# Patient Record
Sex: Female | Born: 1975
Health system: Southern US, Community
[De-identification: ages and names within clinical notes are randomized; demographics above are authoritative.]

## PROBLEM LIST (undated history)

## (undated) DIAGNOSIS — E079 Disorder of thyroid, unspecified: Secondary | ICD-10-CM

## (undated) DIAGNOSIS — K219 Gastro-esophageal reflux disease without esophagitis: Secondary | ICD-10-CM

## (undated) DIAGNOSIS — T7840XA Allergy, unspecified, initial encounter: Secondary | ICD-10-CM

## (undated) DIAGNOSIS — M1990 Unspecified osteoarthritis, unspecified site: Secondary | ICD-10-CM

## (undated) DIAGNOSIS — I10 Essential (primary) hypertension: Secondary | ICD-10-CM

## (undated) HISTORY — DX: Disorder of thyroid, unspecified: E07.9

## (undated) HISTORY — DX: Essential (primary) hypertension: I10

## (undated) HISTORY — DX: Gastro-esophageal reflux disease without esophagitis: K21.9

## (undated) HISTORY — PX: ORIF ANKLE FRACTURE: SHX5408

## (undated) HISTORY — DX: Allergy, unspecified, initial encounter: T78.40XA

## (undated) HISTORY — DX: Unspecified osteoarthritis, unspecified site: M19.90

---

## 2002-11-16 ENCOUNTER — Encounter: Payer: Self-pay | Admitting: Family Medicine

## 2002-11-16 ENCOUNTER — Inpatient Hospital Stay (HOSPITAL_COMMUNITY): Admission: EM | Admit: 2002-11-16 | Discharge: 2002-11-23 | Payer: Self-pay | Admitting: Family Medicine

## 2002-11-26 ENCOUNTER — Inpatient Hospital Stay (HOSPITAL_COMMUNITY): Admission: AD | Admit: 2002-11-26 | Discharge: 2002-11-26 | Payer: Self-pay | Admitting: *Deleted

## 2012-07-28 ENCOUNTER — Ambulatory Visit: Payer: Self-pay | Admitting: Family Medicine

## 2013-07-31 ENCOUNTER — Ambulatory Visit: Payer: Self-pay | Admitting: Orthopedic Surgery

## 2014-12-09 LAB — HM PAP SMEAR: HM PAP: NORMAL

## 2015-10-20 ENCOUNTER — Encounter: Payer: Self-pay | Admitting: Physician Assistant

## 2015-10-20 ENCOUNTER — Ambulatory Visit (INDEPENDENT_AMBULATORY_CARE_PROVIDER_SITE_OTHER): Payer: 59 | Admitting: Physician Assistant

## 2015-10-20 VITALS — BP 116/72 | HR 78 | Temp 98.2°F | Resp 18 | Ht 62.0 in | Wt 180.0 lb

## 2015-10-20 DIAGNOSIS — M5136 Other intervertebral disc degeneration, lumbar region: Secondary | ICD-10-CM | POA: Insufficient documentation

## 2015-10-20 DIAGNOSIS — G43909 Migraine, unspecified, not intractable, without status migrainosus: Secondary | ICD-10-CM | POA: Insufficient documentation

## 2015-10-20 DIAGNOSIS — E039 Hypothyroidism, unspecified: Secondary | ICD-10-CM | POA: Diagnosis not present

## 2015-10-20 DIAGNOSIS — I10 Essential (primary) hypertension: Secondary | ICD-10-CM | POA: Diagnosis not present

## 2015-10-20 DIAGNOSIS — Z7689 Persons encountering health services in other specified circumstances: Secondary | ICD-10-CM

## 2015-10-20 DIAGNOSIS — Z7189 Other specified counseling: Secondary | ICD-10-CM

## 2015-10-20 DIAGNOSIS — E669 Obesity, unspecified: Secondary | ICD-10-CM | POA: Insufficient documentation

## 2015-10-20 DIAGNOSIS — K219 Gastro-esophageal reflux disease without esophagitis: Secondary | ICD-10-CM | POA: Diagnosis not present

## 2015-10-20 DIAGNOSIS — E668 Other obesity: Secondary | ICD-10-CM

## 2015-10-20 DIAGNOSIS — M5126 Other intervertebral disc displacement, lumbar region: Secondary | ICD-10-CM | POA: Insufficient documentation

## 2015-10-20 DIAGNOSIS — IMO0002 Reserved for concepts with insufficient information to code with codable children: Secondary | ICD-10-CM

## 2015-10-20 NOTE — Progress Notes (Signed)
Patient ID: Erika Hart, female   DOB: [birth year], 40 y.o.   MRN: [id number]       Patient: Erika SettleJennifer A Hart Female    DOB: [birth year]   40 y.o.   MRN: [id number] Visit Date: 10/20/2015  Today's Provider: Margaretann LovelessJennifer M Margerite Impastato, PA-C   Chief Complaint  Patient presents with  . New Patient (Initial Visit)  . Hypothyroidism   Subjective:    HPI Pt is here to establish care. Her previous PCP was Lenon [facility]Sarah Gauger at [facility].   She does have history of high blood pressure and is currently on hydrochlorothiazide 12.5 mg. Her blood pressure is well controlled on this dose. She did recently have an episode of dizziness and lightheadedness but realized that she had been taking too much of her medication as she was taking a full 25 mg tab instead of cutting it in half like the directions. Since starting the hydrochlorothiazide correctly she has not had any of those symptoms.  She also has history of hypothyroidism. She states she was diagnosed in her 6720s. She states her most recent thyroid check was done in November of last year. She states her thyroid levels were slightly off and she was to have this rechecked but has not done this as of yet. She states that she feels her thyroid levels are going to be elevated as she is having worsening fatigue and constipation symptoms.  She also has a history of lumbar disc bulge. She has previously seen in orthopedics as well as neurosurgery. Her pain has been well-controlled on dalteparin 75 mg extended release. She decided to not have any surgical intervention being that she was responding well to the ball tearing. She states she does have good days and bad days and it out all depends upon the activity that she does that day.  She also is interested in weight loss. She has been exercising and trying to eat a healthier diet over the last 2 years. She states that over the last 2 years she has lost a total of 45 pounds. She states that over the last  couple of weeks she has hit a plateau and has become very discouraged for this. She does not keep a food diary at this time. She states that she works out 3-4 days per week doing only cardio at this time. She used to do weights as well but recently discontinued weights when she hit the plateau.    Allergies  Allergen Reactions  . Sulfa Antibiotics Rash   Previous Medications   ASPIRIN-ACETAMINOPHEN-CAFFEINE (EXCEDRIN MIGRAINE PO)    Take by mouth.   DICLOFENAC (VOLTAREN) 75 MG EC TABLET    TAKE 1 TABLET (75 MG TOTAL) BY MOUTH 2 (TWO) TIMES DAILY WITH MEALS.   HYDROCHLOROTHIAZIDE (HYDRODIURIL) 25 MG TABLET    TAKE 0.5 TABLETS (12.5 MG TOTAL) BY MOUTH ONCE DAILY.   LEVOTHYROXINE (SYNTHROID, LEVOTHROID) 137 MCG TABLET    TAKE 1 TAB EVERY DAY ON AN EMPTY STOMACH WITH A GLASS OF WATER AT LEAST 30 MIN BEFORE BREAKFAST   SUMATRIPTAN (IMITREX) 100 MG TABLET    Take by mouth.    Review of Systems  Constitutional: Positive for chills, appetite change and fatigue.  Eyes: Negative.   Respiratory: Negative.   Cardiovascular: Negative.   Gastrointestinal: Positive for nausea, abdominal pain, diarrhea, constipation and abdominal distention.  Endocrine: Positive for cold intolerance and polyuria.  Genitourinary: Negative.   Musculoskeletal: Positive for back pain.  Skin: Negative.   Allergic/Immunologic: Negative.  Neurological: Positive for dizziness, light-headedness and headaches.    Social History  Substance Use Topics  . Smoking status: Never Smoker   . Smokeless tobacco: Not on file  . Alcohol Use: Yes     Comment: 1-2 a month   Objective:   BP 116/72 mmHg  Pulse 78  Temp(Src) 98.2 F (36.8 C) (Oral)  Resp 18  Ht  (1.575 m)  Wt 180 lb (81.647 kg)  BMI 32.91 kg/m2  Physical Exam  Constitutional: She is oriented to person, place, and time. She appears well-developed and well-nourished. No distress.  HENT:  Head: Normocephalic and atraumatic.  Right Ear: Tympanic membrane,  external ear and ear canal normal.  Left Ear: Tympanic membrane, external ear and ear canal normal.  Nose: Nose normal.  Mouth/Throat: Uvula is midline, oropharynx is clear and moist and mucous membranes are normal. No oropharyngeal exudate, posterior oropharyngeal edema or posterior oropharyngeal erythema.  Eyes: Conjunctivae and EOM are normal. Pupils are equal, round, and reactive to light. Right eye exhibits no discharge. Left eye exhibits no discharge. No scleral icterus.  Neck: Normal range of motion. Neck supple. No JVD present. No tracheal deviation present. No thyromegaly present.  Cardiovascular: Normal rate, regular rhythm, normal heart sounds and intact distal pulses.  Exam reveals no gallop and no friction rub.   No murmur heard. Pulmonary/Chest: Effort normal and breath sounds normal. No respiratory distress. She has no wheezes. She has no rales. She exhibits no tenderness.  Abdominal: Soft. Bowel sounds are normal. She exhibits no distension and no mass. There is no tenderness. There is no rebound and no guarding.  Musculoskeletal: Normal range of motion. She exhibits no edema or tenderness.  Lymphadenopathy:    She has no cervical adenopathy.  Neurological: She is alert and oriented to person, place, and time.  Skin: Skin is warm and dry. No rash noted. She is not diaphoretic.  Psychiatric: She has a normal mood and affect. Her behavior is normal. Judgment and thought content normal.  Vitals reviewed.       Assessment & Plan:     1. Establishing care with new doctor, encounter for  2. Acquired hypothyroidism Currently on levothyroxine 137 g. I will check thyroid panel as below and follow-up pending lab results. - Thyroid Panel With TSH  3. Essential hypertension Stable on hydrochlorothiazide 12.5 mg. We'll check labs as below. She does state that she has had a history of elevated liver enzymes. She states that the initial time that she had elevated liver enzymes was  approximately 13 years ago after the birth of her first child. She states that her liver enzymes will normalize and then slightly elevated again. She states most recent labs her liver enzymes were normal. She has never had workup for this. - CBC with Differential - Comprehensive Metabolic Panel (CMET)  4. Adult BMI 30+ Currently trying to lose weight. States she has lost 45 pounds over the last 2 years. She is working out 3-4 times per week for 30-40 minutes. She states she is only doing cardio at this time. She states she tries to eat healthy but does not keep a food diary. I did advise her to start keeping a food diary so that she can see how much she is eating. I also advised her to continue her water intake. We also discussed changing up her exercise routine eye little bit as her body is probably become used to what she has been doing. She voiced understanding. I will  see her back in 4 weeks to see how she is doing with these changes. We may consider adding an appetite suppressant at that time if she is still in her plateau.  5. Gastroesophageal reflux disease, esophagitis presence not specified Symptoms come and go. She is not currently on anything for this. She states that she does recognize foods that trigger. She does state that her weight loss has helped her GERD symptoms. She is going to try an over-the-counter treatment such as omeprazole or Zantac. She will call if symptoms worsen.        Margaretann Loveless, PA-C  [facility]

## 2015-10-20 NOTE — Patient Instructions (Signed)

## 2015-10-21 LAB — COMPREHENSIVE METABOLIC PANEL
A/G RATIO: 1.6 (ref 1.2–2.2)
ALT: 46 IU/L — AB (ref 0–32)
AST: 42 IU/L — AB (ref 0–40)
Albumin: 4.5 g/dL (ref 3.5–5.5)
Alkaline Phosphatase: 73 IU/L (ref 39–117)
BUN/Creatinine Ratio: 18 (ref 9–23)
BUN: 16 mg/dL (ref 6–24)
Bilirubin Total: 1 mg/dL (ref 0.0–1.2)
CALCIUM: 9.7 mg/dL (ref 8.7–10.2)
CHLORIDE: 98 mmol/L (ref 96–106)
CO2: 21 mmol/L (ref 18–29)
Creatinine, Ser: 0.91 mg/dL (ref 0.57–1.00)
GFR calc Af Amer: 91 mL/min/{1.73_m2} (ref >59)
GFR, EST NON AFRICAN AMERICAN: 79 mL/min/{1.73_m2} (ref >59)
GLUCOSE: 78 mg/dL (ref 65–99)
Globulin, Total: 2.9 g/dL (ref 1.5–4.5)
POTASSIUM: 4.2 mmol/L (ref 3.5–5.2)
Sodium: 141 mmol/L (ref 134–144)
Total Protein: 7.4 g/dL (ref 6.0–8.5)

## 2015-10-21 LAB — CBC WITH DIFFERENTIAL/PLATELET
BASOS ABS: 0 10*3/uL (ref 0.0–0.2)
Basos: 1 %
EOS (ABSOLUTE): 0 10*3/uL (ref 0.0–0.4)
Eos: 1 %
Hematocrit: 40.7 % (ref 34.0–46.6)
Hemoglobin: 14.3 g/dL (ref 11.1–15.9)
IMMATURE GRANS (ABS): 0 10*3/uL (ref 0.0–0.1)
IMMATURE GRANULOCYTES: 0 %
LYMPHS: 33 %
Lymphocytes Absolute: 1 10*3/uL (ref 0.7–3.1)
MCH: 31.3 pg (ref 26.6–33.0)
MCHC: 35.1 g/dL (ref 31.5–35.7)
MCV: 89 fL (ref 79–97)
MONOS ABS: 0.6 10*3/uL (ref 0.1–0.9)
Monocytes: 20 %
NEUTROS PCT: 45 %
Neutrophils Absolute: 1.4 10*3/uL (ref 1.4–7.0)
PLATELETS: 258 10*3/uL (ref 150–379)
RBC: 4.57 x10E6/uL (ref 3.77–5.28)
RDW: 13.1 % (ref 12.3–15.4)
WBC: 3.2 10*3/uL — AB (ref 3.4–10.8)

## 2015-10-21 LAB — THYROID PANEL WITH TSH
FREE THYROXINE INDEX: 3.3 (ref 1.2–4.9)
T3 UPTAKE RATIO: 29 % (ref 24–39)
T4 TOTAL: 11.5 ug/dL (ref 4.5–12.0)
TSH: 2.04 u[IU]/mL (ref 0.450–4.500)

## 2015-10-23 ENCOUNTER — Telehealth: Payer: Self-pay

## 2015-10-23 NOTE — Telephone Encounter (Signed)
-----   Message from Margaretann LovelessJennifer M Burnette, [place]PA-C sent at 10/21/2015  9:03 AM EDT ----- All labs are within normal limits and stable.  AST and ALT (liver enzymes) are both borderline elevated.  We will continue to monitor these since they have been elevated in the past. Will recheck in 6 months. Thanks! -JB

## 2015-10-23 NOTE — Telephone Encounter (Signed)
Patient advised as directed below.  Thanks,  -Azlee Monforte 

## 2015-11-17 ENCOUNTER — Ambulatory Visit (INDEPENDENT_AMBULATORY_CARE_PROVIDER_SITE_OTHER): Payer: 59 | Admitting: Physician Assistant

## 2015-11-17 ENCOUNTER — Encounter: Payer: Self-pay | Admitting: Physician Assistant

## 2015-11-17 VITALS — BP 120/80 | HR 72 | Temp 98.2°F | Resp 16 | Wt 182.6 lb

## 2015-11-17 DIAGNOSIS — Z713 Dietary counseling and surveillance: Secondary | ICD-10-CM | POA: Diagnosis not present

## 2015-11-17 DIAGNOSIS — E668 Other obesity: Secondary | ICD-10-CM | POA: Diagnosis not present

## 2015-11-17 DIAGNOSIS — IMO0002 Reserved for concepts with insufficient information to code with codable children: Secondary | ICD-10-CM

## 2015-11-17 DIAGNOSIS — E669 Obesity, unspecified: Secondary | ICD-10-CM | POA: Diagnosis not present

## 2015-11-17 MED ORDER — PHENTERMINE HCL 37.5 MG PO CAPS: 37.5000 mg | ORAL_CAPSULE | ORAL | Status: DC

## 2015-11-17 NOTE — Patient Instructions (Signed)
Phentermine tablets or capsules What is this medicine? PHENTERMINE (FEN ter meen) decreases your appetite. It is used with a reduced calorie diet and exercise to help you lose weight. This medicine may be used for other purposes; ask your health care provider or pharmacist if you have questions. What should I tell my health care provider before I take this medicine? They need to know if you have any of these conditions: -agitation -glaucoma -heart disease -high blood pressure -history of substance abuse -lung disease called Primary Pulmonary Hypertension (PPH) -taken an MAOI like Carbex, Eldepryl, Marplan, Nardil, or Parnate in last 14 days -thyroid disease -an unusual or allergic reaction to phentermine, other medicines, foods, dyes, or preservatives -pregnant or trying to get pregnant -breast-feeding How should I use this medicine? Take this medicine by mouth with a glass of water. Follow the directions on the prescription label. This medicine is usually taken 30 minutes before or 1 to 2 hours after breakfast. Avoid taking this medicine in the evening. It may interfere with sleep. Take your doses at regular intervals. Do not take your medicine more often than directed. Talk to your pediatrician regarding the use of this medicine in children. Special care may be needed. Overdosage: If you think you have taken too much of this medicine contact a poison control center or emergency room at once. NOTE: This medicine is only for you. Do not share this medicine with others. What if I miss a dose? If you miss a dose, take it as soon as you can. If it is almost time for your next dose, take only that dose. Do not take double or extra doses. What may interact with this medicine? Do not take this medicine with any of the following medications: -duloxetine -MAOIs like Carbex, Eldepryl, Marplan, Nardil, and Parnate -medicines for colds or breathing difficulties like pseudoephedrine or  phenylephrine -procarbazine -sibutramine -SSRIs like citalopram, escitalopram, fluoxetine, fluvoxamine, paroxetine, and sertraline -stimulants like dexmethylphenidate, methylphenidate or modafinil -venlafaxine This medicine may also interact with the following medications: -medicines for diabetes This list may not describe all possible interactions. Give your health care provider a list of all the medicines, herbs, non-prescription drugs, or dietary supplements you use. Also tell them if you smoke, drink alcohol, or use illegal drugs. Some items may interact with your medicine. What should I watch for while using this medicine? Notify your physician immediately if you become short of breath while doing your normal activities. Do not take this medicine within 6 hours of bedtime. It can keep you from getting to sleep. Avoid drinks that contain caffeine and try to stick to a regular bedtime every night. This medicine was intended to be used in addition to a healthy diet and exercise. The best results are achieved this way. This medicine is only indicated for short-term use. Eventually your weight loss may level out. At that point, the drug will only help you maintain your new weight. Do not increase or in any way change your dose without consulting your doctor. You may get drowsy or dizzy. Do not drive, use machinery, or do anything that needs mental alertness until you know how this medicine affects you. Do not stand or sit up quickly, especially if you are an older patient. This reduces the risk of dizzy or fainting spells. Alcohol may increase dizziness and drowsiness. Avoid alcoholic drinks. What side effects may I notice from receiving this medicine? Side effects that you should report to your doctor or health care professional as soon   as possible: -chest pain, palpitations -depression or severe changes in mood -increased blood pressure -irritability -nervousness or restlessness -severe  dizziness -shortness of breath -problems urinating -unusual swelling of the legs -vomiting Side effects that usually do not require medical attention (report to your doctor or health care professional if they continue or are bothersome): -blurred vision or other eye problems -changes in sexual ability or desire -constipation or diarrhea -difficulty sleeping -dry mouth or unpleasant taste -headache -nausea This list may not describe all possible side effects. Call your doctor for medical advice about side effects. You may report side effects to FDA at 1-800-FDA-1088. Where should I keep my medicine? Keep out of the reach of children. This medicine can be abused. Keep your medicine in a safe place to protect it from theft. Do not share this medicine with anyone. Selling or giving away this medicine is dangerous and against the law. This medicine may cause accidental overdose and death if taken by other adults, children, or pets. Mix any unused medicine with a substance like cat litter or coffee grounds. Then throw the medicine away in a sealed container like a sealed bag or a coffee can with a lid. Do not use the medicine after the expiration date. Store at room temperature between 20 and 25 degrees C (68 and 77 degrees F). Keep container tightly closed. NOTE: This sheet is a summary. It may not cover all possible information. If you have questions about this medicine, talk to your doctor, pharmacist, or health care provider.    2016, Elsevier/Gold Standard. (2014-04-05 16:19:53)  

## 2015-11-17 NOTE — Progress Notes (Signed)
Patient: Erika SettleJennifer A Troung Female    DOB: Jan 31, 1976   40 y.o.   MRN: [id number] Visit Date: 11/17/2015  Today's Provider: Margaretann LovelessJennifer M Valia Wingard, PA-C   Chief Complaint  Patient presents with  . Follow-up    weight   Subjective:    HPI Patient is here for 4 week follow-up weight. She reports keeping a food diary for two weeks only. She has been trying to adhere to a 1300 calorie diet. On review some days she was over, other days she was under. Also she has been wearing a fitbit to keep track of her steps and trying to get between 5000-10000 steps daily. She also ran a 5k two weekends ago.  Current Exercise Habits: Structured exercise class (Goes to the gym), Type of exercise: strength training/weights;Other - see comments (Mostly Cardio), Time (Minutes): 40 (30-40  minutes), Frequency (Times/Week): 4, Weekly Exercise (Minutes/Week): 160, Intensity: Intense Exercise limited by: None identified Wt Readings from Last 3 Encounters:  11/17/15 182 lb 9.6 oz (82.827 kg)  10/20/15 180 lb (81.647 kg)   She does report, however, that she has not exercised regularly over the last week and a half due to having an URI and decreased energy from that.   On discussion of her diet, she has a tendency to eat at least one dessert item daily. We discussed cutting back to only having 1-2 per week, or only 1-2 bites if she eats them more frequently.     Allergies  Allergen Reactions  . Sulfa Antibiotics Rash   Previous Medications   ASPIRIN-ACETAMINOPHEN-CAFFEINE (EXCEDRIN MIGRAINE PO)    Take by mouth.   DICLOFENAC (VOLTAREN) 75 MG EC TABLET    TAKE 1 TABLET (75 MG TOTAL) BY MOUTH 2 (TWO) TIMES DAILY WITH MEALS.   HYDROCHLOROTHIAZIDE (HYDRODIURIL) 25 MG TABLET    TAKE 0.5 TABLETS (12.5 MG TOTAL) BY MOUTH ONCE DAILY.   LEVOTHYROXINE (SYNTHROID, LEVOTHROID) 137 MCG TABLET    TAKE 1 TAB EVERY DAY ON AN EMPTY STOMACH WITH A GLASS OF WATER AT LEAST 30 MIN BEFORE BREAKFAST   SUMATRIPTAN (IMITREX) 100  MG TABLET    Take by mouth.    Review of Systems  Constitutional: Positive for fatigue.  HENT: Positive for sore throat (off and on left side hurts, but improving-was worse last week).   Respiratory: Negative.   Cardiovascular: Negative for chest pain, palpitations and leg swelling.  Psychiatric/Behavioral: Negative.     Social History  Substance Use Topics  . Smoking status: Never Smoker   . Smokeless tobacco: Not on file  . Alcohol Use: Yes     Comment: 1-2 a month   Objective:   BP 120/80 mmHg  Pulse 72  Temp(Src) 98.2 F (36.8 C) (Oral)  Resp 16  Wt 182 lb 9.6 oz (82.827 kg)  LMP   Physical Exam  Constitutional: She appears well-developed and well-nourished. No distress.  Neck: Normal range of motion. Neck supple.  Cardiovascular: Normal rate, regular rhythm and normal heart sounds.  Exam reveals no gallop and no friction rub.   No murmur heard. Pulmonary/Chest: Effort normal and breath sounds normal. No respiratory distress. She has no wheezes. She has no rales.  Skin: She is not diaphoretic.  Psychiatric: She has a normal mood and affect. Her behavior is normal. Judgment and thought content normal.  Vitals reviewed.       Assessment & Plan:     1. Encounter for weight loss counseling Discussed starting to become  more consistent with her food diary and making sure to log all meals and snacks. Continue increasing water intake. Continue 1300-calorie diet. She has been exercising well and getting a total of 160 minutes per week. She has been trying to cut out snacks. Complains of fatigue onset in later day. Discussed adding complex carbs in the morning with breakfast instead of being later in the day. Will also add phentermine as below for appetite suppression. I will see her back in 4 weeks to check her progress. She is to call if she has any adverse reactions, acute issues, questions or concerns. - phentermine 37.5 MG capsule; Take 1 capsule (37.5 mg total) by mouth  every morning.  Dispense: 30 capsule; Refill: 0  2. Adult BMI 30+ See above medical treatment plan. - phentermine 37.5 MG capsule; Take 1 capsule (37.5 mg total) by mouth every morning.  Dispense: 30 capsule; Refill: 0  3. Obese See above medical treatment plan. - phentermine 37.5 MG capsule; Take 1 capsule (37.5 mg total) by mouth every morning.  Dispense: 30 capsule; Refill: 0       Margaretann Loveless, PA-C  [facility]

## 2015-11-20 ENCOUNTER — Encounter: Payer: Self-pay | Admitting: Physician Assistant

## 2015-11-20 DIAGNOSIS — M1711 Unilateral primary osteoarthritis, right knee: Secondary | ICD-10-CM

## 2015-11-20 MED ORDER — DICLOFENAC SODIUM 75 MG PO TBEC: DELAYED_RELEASE_TABLET | ORAL | Status: DC

## 2015-12-15 ENCOUNTER — Ambulatory Visit (INDEPENDENT_AMBULATORY_CARE_PROVIDER_SITE_OTHER): Payer: 59 | Admitting: Physician Assistant

## 2015-12-15 ENCOUNTER — Encounter: Payer: Self-pay | Admitting: Physician Assistant

## 2015-12-15 VITALS — BP 120/80 | HR 102 | Temp 98.1°F | Resp 16 | Wt 175.2 lb

## 2015-12-15 DIAGNOSIS — E669 Obesity, unspecified: Secondary | ICD-10-CM | POA: Diagnosis not present

## 2015-12-15 DIAGNOSIS — I10 Essential (primary) hypertension: Secondary | ICD-10-CM | POA: Diagnosis not present

## 2015-12-15 DIAGNOSIS — E668 Other obesity: Secondary | ICD-10-CM | POA: Diagnosis not present

## 2015-12-15 DIAGNOSIS — Z713 Dietary counseling and surveillance: Secondary | ICD-10-CM

## 2015-12-15 DIAGNOSIS — IMO0002 Reserved for concepts with insufficient information to code with codable children: Secondary | ICD-10-CM

## 2015-12-15 MED ORDER — HYDROCHLOROTHIAZIDE 12.5 MG PO CAPS: 12.5000 mg | ORAL_CAPSULE | Freq: Every day | ORAL | Status: DC

## 2015-12-15 MED ORDER — PHENTERMINE HCL 37.5 MG PO CAPS: 37.5000 mg | ORAL_CAPSULE | ORAL | Status: DC

## 2015-12-15 NOTE — Patient Instructions (Signed)

## 2015-12-15 NOTE — Progress Notes (Signed)
Patient: Erika SettleJennifer A Hart Female    DOB: October 18, 1975   40 y.o.   MRN: [id number] Visit Date: 12/15/2015  Today's Provider: Margaretann LovelessJennifer M Delane Wessinger, PA-C   Chief Complaint  Patient presents with  . Follow-up    weight   Subjective:    HPIPatient is here following up on obesity. She reports she is having three meals a day, supper usually a shake with one snack a day which is strawberries or apple slices.She is not counting calories or keeping a diary. She is on Phentermine to help with appetite.She reports that in the first week the phentermine made her feel jittery. No adverse effects now.  Home Exercise  12/15/2015 11/17/2015  Current Exercise Habits Structured exercise class Structured exercise class  Type of exercise strength training/weights;Other - see comments strength training/weights;Other - see comments  Time (Minutes) 40 40  Frequency (Times/Week) 4 4  Weekly Exercise (Minutes/Week) 160 160  Intensity Intense Intense  Exercise limited by: None identified None identified   Wt Readings from Last 3 Encounters:  12/15/15 175 lb 3.2 oz (79.47 kg)  11/17/15 182 lb 9.6 oz (82.827 kg)  10/20/15 180 lb (81.647 kg)       Allergies  Allergen Reactions  . Sulfa Antibiotics Rash   Previous Medications   ASPIRIN-ACETAMINOPHEN-CAFFEINE (EXCEDRIN MIGRAINE PO)    Take by mouth.   DICLOFENAC (VOLTAREN) 75 MG EC TABLET    TAKE 1 TABLET (75 MG TOTAL) BY MOUTH 2 (TWO) TIMES DAILY WITH MEALS.   HYDROCHLOROTHIAZIDE (HYDRODIURIL) 25 MG TABLET    TAKE 0.5 TABLETS (12.5 MG TOTAL) BY MOUTH ONCE DAILY.   LEVOTHYROXINE (SYNTHROID, LEVOTHROID) 137 MCG TABLET    TAKE 1 TAB EVERY DAY ON AN EMPTY STOMACH WITH A GLASS OF WATER AT LEAST 30 MIN BEFORE BREAKFAST   PHENTERMINE 37.5 MG CAPSULE    Take 1 capsule (37.5 mg total) by mouth every morning.   SUMATRIPTAN (IMITREX) 100 MG TABLET    Take by mouth.    Review of Systems  Constitutional: Negative.   HENT: Negative.   Respiratory: Negative.    Cardiovascular: Negative.   Psychiatric/Behavioral: Negative.     Social History  Substance Use Topics  . Smoking status: Never Smoker   . Smokeless tobacco: Not on file  . Alcohol Use: Yes     Comment: 1-2 a month   Objective:   BP 120/80 mmHg  Pulse 102  Temp(Src) 98.1 F (36.7 C) (Oral)  Resp 16  Wt 175 lb 3.2 oz (79.47 kg)  Physical Exam  Constitutional: She appears well-developed and well-nourished. No distress.  Neck: Normal range of motion. Neck supple.  Cardiovascular: Normal rate, regular rhythm and normal heart sounds.  Exam reveals no gallop and no friction rub.   No murmur heard. Pulmonary/Chest: Effort normal and breath sounds normal. No respiratory distress. She has no wheezes. She has no rales.  Skin: She is not diaphoretic.  Vitals reviewed.       Assessment & Plan:     1. Adult BMI 30+ She is doing very well and has lost 7 pounds in the last 4 weeks which is right with goal of 1-2 pounds per week. Continue current regimen. Will continue phentermine for appetite suppression. I will see her back in 4 weeks for weight check.  - phentermine 37.5 MG capsule; Take 1 capsule (37.5 mg total) by mouth every morning.  Dispense: 30 capsule; Refill: 0  2. Encounter for weight loss counseling See above  medical treatment plan. - phentermine 37.5 MG capsule; Take 1 capsule (37.5 mg total) by mouth every morning.  Dispense: 30 capsule; Refill: 0  3. Obese See above medical treatment plan. - phentermine 37.5 MG capsule; Take 1 capsule (37.5 mg total) by mouth every morning.  Dispense: 30 capsule; Refill: 0  4. Essential hypertension Stable. Diagnosis pulled for medication refill. Continue current medical treatment plan. - hydrochlorothiazide (MICROZIDE) 12.5 MG capsule; Take 1 capsule (12.5 mg total) by mouth daily.  Dispense: 30 capsule; Refill: 6       Margaretann Loveless, PA-C  [facility]

## 2015-12-27 ENCOUNTER — Other Ambulatory Visit: Payer: Self-pay

## 2015-12-27 DIAGNOSIS — M1711 Unilateral primary osteoarthritis, right knee: Secondary | ICD-10-CM

## 2015-12-27 MED ORDER — DICLOFENAC SODIUM 75 MG PO TBEC: DELAYED_RELEASE_TABLET | ORAL | Status: DC

## 2015-12-27 NOTE — Telephone Encounter (Signed)
Patient is requesting refill on Diclofenac. She is also wanting BP medication to be sent into [facility] in [place]. Ok to send? Please advise. Thanks!

## 2015-12-27 NOTE — Telephone Encounter (Deleted)
Patient is requesting refill. Pa

## 2016-01-12 ENCOUNTER — Ambulatory Visit (INDEPENDENT_AMBULATORY_CARE_PROVIDER_SITE_OTHER): Payer: 59 | Admitting: Physician Assistant

## 2016-01-12 ENCOUNTER — Encounter: Payer: Self-pay | Admitting: Physician Assistant

## 2016-01-12 VITALS — BP 110/80 | HR 100 | Temp 98.0°F | Resp 16 | Wt 168.8 lb

## 2016-01-12 DIAGNOSIS — E669 Obesity, unspecified: Secondary | ICD-10-CM | POA: Diagnosis not present

## 2016-01-12 DIAGNOSIS — E668 Other obesity: Secondary | ICD-10-CM

## 2016-01-12 DIAGNOSIS — K2960 Other gastritis without bleeding: Secondary | ICD-10-CM | POA: Diagnosis not present

## 2016-01-12 DIAGNOSIS — IMO0002 Reserved for concepts with insufficient information to code with codable children: Secondary | ICD-10-CM

## 2016-01-12 DIAGNOSIS — Z713 Dietary counseling and surveillance: Secondary | ICD-10-CM

## 2016-01-12 DIAGNOSIS — T3991XA Poisoning by unspecified nonopioid analgesic, antipyretic and antirheumatic, accidental (unintentional), initial encounter: Secondary | ICD-10-CM | POA: Diagnosis not present

## 2016-01-12 DIAGNOSIS — T39395A Adverse effect of other nonsteroidal anti-inflammatory drugs [NSAID], initial encounter: Secondary | ICD-10-CM

## 2016-01-12 MED ORDER — PHENTERMINE HCL 37.5 MG PO CAPS: 37.5000 mg | ORAL_CAPSULE | ORAL | Status: DC

## 2016-01-12 NOTE — Patient Instructions (Signed)

## 2016-01-12 NOTE — Progress Notes (Signed)
Patient: Erika Hart Female    DOB: 08-10-1975   40 y.o.   MRN: [id number] Visit Date: 01/12/2016  Today's Provider: Margaretann Loveless, PA-C   Chief Complaint  Patient presents with  . Follow-up    Weight loss counseling   Subjective:    HPIPatient is here to follow-up on weight loss counseling. She is not keeping a food diary. She is using her fitbit to keep track of her steps. She drinks a protein shake after work out.  Home Exercise  01/12/2016 01/12/2016 12/15/2015 11/17/2015  Current Exercise Habits - Structured exercise class Structured exercise class Structured exercise class  Type of exercise - strength training/weights strength training/weights;Other - see comments strength training/weights;Other - see comments  Time (Minutes) - 40 40 40  Frequency (Times/Week) - Weekly Exercise (Minutes/Week) - 160 160 160  Intensity Intense Moderate Intense Intense  Exercise limited by: - None identified None identified None identified   Wt Readings from Last 3 Encounters:  01/12/16 168 lb 12.8 oz (76.567 kg)  12/15/15 175 lb 3.2 oz (79.47 kg)  11/17/15 182 lb 9.6 oz (82.827 kg)      Allergies  Allergen Reactions  . Sulfa Antibiotics Rash   Current Meds  Medication Sig  . Aspirin-Acetaminophen-Caffeine (EXCEDRIN MIGRAINE PO) Take by mouth.  . diclofenac (VOLTAREN) 75 MG EC tablet TAKE 1 TABLET (75 MG TOTAL) BY MOUTH 2 (TWO) TIMES DAILY WITH MEALS.  . hydrochlorothiazide (MICROZIDE) 12.5 MG capsule Take 1 capsule (12.5 mg total) by mouth daily.  Marland Kitchen levothyroxine (SYNTHROID, LEVOTHROID) 137 MCG tablet TAKE 1 TAB EVERY DAY ON AN EMPTY STOMACH WITH A GLASS OF WATER AT LEAST 30 MIN BEFORE BREAKFAST  . phentermine 37.5 MG capsule Take 1 capsule (37.5 mg total) by mouth every morning.    Review of Systems  Constitutional: Negative.   Respiratory: Negative.   Cardiovascular: Negative.   Gastrointestinal: Positive for constipation.  Psychiatric/Behavioral:  Negative.     Social History  Substance Use Topics  . Smoking status: Never Smoker   . Smokeless tobacco: Not on file  . Alcohol Use: Yes     Comment: 1-2 a month   Objective:   BP 110/80 mmHg  Pulse 100  Temp(Src) 98 F (36.7 C) (Oral)  Resp 16  Wt 168 lb 12.8 oz (76.567 kg)  Physical Exam  Constitutional: She appears well-developed and well-nourished. No distress.  Cardiovascular: Normal rate, regular rhythm and normal heart sounds.  Exam reveals no gallop and no friction rub.   No murmur heard. Pulmonary/Chest: Effort normal and breath sounds normal. No respiratory distress. She has no wheezes. She has no rales.  Skin: She is not diaphoretic.  Vitals reviewed.     Assessment & Plan:     1. Encounter for weight loss counseling She continue to do well with her weight loss. She does mention some decreased appetite control of the phentermine but is still adhering to a low calorie diet. She reports she has increased her exercise and weight resistance training and feels that is also why she feels more hungry. Will go to every other day use for one week. If still not feeling appetite suppression will discontinue for 1-2 weeks then restart. I will see her back in 5 weeks for weight recheck. - phentermine 37.5 MG capsule; Take 1 capsule (37.5 mg total) by mouth every morning.  Dispense: 30 capsule; Refill: 0  2. Adult BMI 30+ See above medical treatment plan. -  phentermine 37.5 MG capsule; Take 1 capsule (37.5 mg total) by mouth every morning.  Dispense: 30 capsule; Refill: 0  3. Obese See above medical treatment plan. - phentermine 37.5 MG capsule; Take 1 capsule (37.5 mg total) by mouth every morning.  Dispense: 30 capsule; Refill: 0  4. Gastritis due to nonsteroidal anti-inflammatory drug (NSAID) Was starting to have belching and abdominal pain in the epigastric region. Started omeprazole OTC BID and has decreased the use of her voltaren. Also now taking voltaren with food.  Reports symptoms have improved. - omeprazole (PRILOSEC) 20 MG capsule; Take 1 capsule (20 mg total) by mouth 2 (two) times daily before a meal.  Dispense: 60 capsule; Refill: 0       Margaretann LovelessJennifer M Sharvil Hoey, PA-C  [facility]

## 2016-02-16 ENCOUNTER — Encounter: Payer: Self-pay | Admitting: Physician Assistant

## 2016-02-16 ENCOUNTER — Ambulatory Visit (INDEPENDENT_AMBULATORY_CARE_PROVIDER_SITE_OTHER): Payer: 59 | Admitting: Physician Assistant

## 2016-02-16 VITALS — BP 100/60 | HR 97 | Temp 98.2°F | Resp 16 | Wt 165.6 lb

## 2016-02-16 DIAGNOSIS — Z713 Dietary counseling and surveillance: Secondary | ICD-10-CM

## 2016-02-16 DIAGNOSIS — E668 Other obesity: Secondary | ICD-10-CM | POA: Diagnosis not present

## 2016-02-16 DIAGNOSIS — E669 Obesity, unspecified: Secondary | ICD-10-CM

## 2016-02-16 DIAGNOSIS — IMO0002 Reserved for concepts with insufficient information to code with codable children: Secondary | ICD-10-CM

## 2016-02-16 MED ORDER — PHENTERMINE HCL 37.5 MG PO CAPS: 37.5000 mg | ORAL_CAPSULE | ORAL | Status: DC

## 2016-02-16 NOTE — Progress Notes (Signed)
Patient: Erika SettleJennifer A Hart Female    DOB: 1976/02/15   40 y.o.   MRN: [id number] Visit Date: 02/16/2016  Today's Provider: Margaretann LovelessJennifer M Burnette, PA-C   Chief Complaint  Patient presents with  . Follow-up    weight loss counselineg   Subjective:    HPI Patient is here to follow up on weight loss counseling. She reports that she is still exercising 3-4 times a week. She is eating small meal portions. Last office visit weight she was 168 lbs today her weight is 165.6 lbs. She does mention that she went on a week long vacation and not too long ago and did not take the phentermine. She did not watch what she was eating and gained 7 pounds back. She has done well since then and was able to get the 7 pounds off plus 3-4 pounds more.     Allergies  Allergen Reactions  . Sulfa Antibiotics Rash   Current Meds  Medication Sig  . Aspirin-Acetaminophen-Caffeine (EXCEDRIN MIGRAINE PO) Take by mouth.  . diclofenac (VOLTAREN) 75 MG EC tablet TAKE 1 TABLET (75 MG TOTAL) BY MOUTH 2 (TWO) TIMES DAILY WITH MEALS.  . hydrochlorothiazide (MICROZIDE) 12.5 MG capsule Take 1 capsule (12.5 mg total) by mouth daily.  Marland Kitchen. levothyroxine (SYNTHROID, LEVOTHROID) 137 MCG tablet TAKE 1 TAB EVERY DAY ON AN EMPTY STOMACH WITH A GLASS OF WATER AT LEAST 30 MIN BEFORE BREAKFAST  . omeprazole (PRILOSEC) 20 MG capsule Take 1 capsule (20 mg total) by mouth 2 (two) times daily before a meal.  . phentermine 37.5 MG capsule Take 1 capsule (37.5 mg total) by mouth every morning.    Review of Systems  Constitutional: Negative.   Respiratory: Negative.   Cardiovascular: Negative.   Gastrointestinal: Negative.   Neurological: Negative.   Psychiatric/Behavioral: Negative.     Social History  Substance Use Topics  . Smoking status: Never Smoker   . Smokeless tobacco: Not on file  . Alcohol Use: Yes     Comment: 1-2 a month   Objective:   BP 100/60 mmHg  Pulse 97  Temp(Src) 98.2 F (36.8 C) (Oral)  Resp 16   Wt 165 lb 9.6 oz (75.116 kg)  Physical Exam  Constitutional: She appears well-developed and well-nourished. No distress.  Cardiovascular: Normal rate, regular rhythm and normal heart sounds.  Exam reveals no gallop and no friction rub.   No murmur heard. Pulmonary/Chest: Effort normal and breath sounds normal. No respiratory distress. She has no wheezes. She has no rales.  Skin: She is not diaphoretic.  Psychiatric: She has a normal mood and affect. Her behavior is normal. Judgment and thought content normal.  Vitals reviewed.     Assessment & Plan:     1. Encounter for weight loss counseling Doing well. Will continue phentermine for appetite suppression. Continue small portions and exercise. I will see her back in 4 weeks to recheck her weight. - phentermine 37.5 MG capsule; Take 1 capsule (37.5 mg total) by mouth every morning.  Dispense: 30 capsule; Refill: 0  2. Adult BMI 30+ See above medical treatment plan. - phentermine 37.5 MG capsule; Take 1 capsule (37.5 mg total) by mouth every morning.  Dispense: 30 capsule; Refill: 0  3. Obese See above medical treatment plan. - phentermine 37.5 MG capsule; Take 1 capsule (37.5 mg total) by mouth every morning.  Dispense: 30 capsule; Refill: 0       Margaretann LovelessJennifer M Burnette, PA-C  [facility]

## 2016-03-04 ENCOUNTER — Ambulatory Visit (INDEPENDENT_AMBULATORY_CARE_PROVIDER_SITE_OTHER): Payer: 59 | Admitting: Physician Assistant

## 2016-03-04 ENCOUNTER — Encounter: Payer: Self-pay | Admitting: Physician Assistant

## 2016-03-04 VITALS — BP 100/60 | HR 84 | Temp 97.8°F | Wt 171.0 lb

## 2016-03-04 DIAGNOSIS — L259 Unspecified contact dermatitis, unspecified cause: Secondary | ICD-10-CM | POA: Diagnosis not present

## 2016-03-04 MED ORDER — METHYLPREDNISOLONE ACETATE 80 MG/ML IJ SUSP
80.0000 mg | Freq: Once | INTRAMUSCULAR | Status: AC
  Administered 2016-03-04: 80 mg via INTRAMUSCULAR

## 2016-03-04 MED ORDER — PREDNISONE 10 MG (21) PO TBPK: ORAL_TABLET | ORAL | 0 refills | Status: DC

## 2016-03-04 NOTE — Patient Instructions (Signed)
Poison Oak Poison oak is an inflammation of the skin (contact dermatitis). It is caused by contact with the allergens on the leaves of the oak (toxicodendron) plants. Depending on your sensitivity, the rash may consist simply of redness and itching, or it may also progress to blisters which may break open (rupture). These must be well cared for to prevent secondary germ (bacterial) infection as these infections can lead to scarring. The eyes may also get puffy. The puffiness is worst in the morning and gets better as the day progresses. Healing is best accomplished by keeping any open areas dry, clean, covered with a bandage, and covered with an antibacterial ointment if needed. Without secondary infection, this dermatitis usually heals without scarring within 2 to 3 weeks without treatment. HOME CARE INSTRUCTIONS When you have been exposed to poison oak, it is very important to thoroughly wash with soap and water as soon as the exposure has been discovered. You have about one half hour to remove the plant resin before it will cause the rash. This cleaning will quickly destroy the oil or antigen on the skin (the antigen is what causes the rash). Wash aggressively under the fingernails as any plant resin still there will continue to spread the rash. Do not rub skin vigorously when washing affected area. Poison oak cannot spread if no oil from the plant remains on your body. Rash that has progressed to weeping sores (lesions) will not spread the rash unless you have not washed thoroughly. It is also important to clean any clothes you have been wearing as they may carry active allergens which will spread the rash, even several days later. Avoidance of the plant in the future is the best measure. Poison oak plants can be recognized by the number of leaves. Generally, poison oak has three leaves with flowering branches on a single stem. Diphenhydramine may be purchased over the counter and used as needed for  itching. Do not drive with this medication if it makes you drowsy. Ask your caregiver about medication for children. SEEK IMMEDIATE MEDICAL CARE IF:   Open areas of the rash develop.  You notice redness extending beyond the area of the rash.  There is a pus like discharge.  There is increased pain.  Other signs of infection develop (such as fever).   This information is not intended to replace advice given to you by your health care provider. Make sure you discuss any questions you have with your health care provider.   Document Released: 01/19/2003 Document Revised: 10/07/2011 Document Reviewed: 12/21/2014 Elsevier Interactive Patient Education 2016 Elsevier Inc.  

## 2016-03-04 NOTE — Progress Notes (Signed)
       Patient: Erika SettleJennifer A Hart Female    DOB: September 22, 1975   40 y.o.   MRN: [id number] Visit Date: 03/04/2016  Today's Provider: Margaretann LovelessJennifer M Sydnee Lamour, PA-C   Chief Complaint  Patient presents with  . Rash   Subjective:    Rash  This is a new problem. The current episode started in the past 7 days (Since Thursday). The problem has been gradually worsening since onset. The affected locations include the abdomen, left arm, right arm, chest, right upper leg and left upper leg. The rash is characterized by blistering, itchiness, draining and pain. She was exposed to plant contact (pulling weeds two weekends ago). Associated symptoms include a sore throat. Pertinent negatives include no anorexia, congestion, cough, diarrhea, eye pain, facial edema, fatigue, fever, joint pain, rhinorrhea, shortness of breath or vomiting. Past treatments include topical steroids, anti-itch cream and antihistamine. The treatment provided no relief.      Allergies  Allergen Reactions  . Sulfa Antibiotics Rash   No outpatient prescriptions have been marked as taking for the 03/04/16 encounter (Office Visit) with Erika LovelessJennifer M Krew Hortman, PA-C.    Review of Systems  Constitutional: Negative for fatigue and fever.  HENT: Positive for sore throat. Negative for congestion, facial swelling, rhinorrhea and trouble swallowing.   Eyes: Negative for pain.  Respiratory: Negative for cough, chest tightness and shortness of breath.   Cardiovascular: Negative for chest pain and leg swelling.  Gastrointestinal: Negative for anorexia, diarrhea and vomiting.  Musculoskeletal: Negative for joint pain.  Skin: Positive for rash.    Social History  Substance Use Topics  . Smoking status: Never Smoker  . Smokeless tobacco: Not on file  . Alcohol use Yes     Comment: 1-2 a month   Objective:   BP 100/60 (BP Location: Left Arm, Patient Position: Sitting, Cuff Size: Normal)   Pulse 84   Temp 97.8 F (36.6 C) (Oral)   Wt 171 lb  (77.6 kg)   LMP 01/26/2016   BMI 31.28 kg/m   Physical Exam  Constitutional: She appears well-developed and well-nourished. No distress.  Cardiovascular: Normal rate, regular rhythm and normal heart sounds.  Exam reveals no gallop and no friction rub.   No murmur heard. Pulmonary/Chest: Effort normal and breath sounds normal. No respiratory distress. She has no wheezes. She has no rales.  Skin: Rash noted. Rash is vesicular. She is not diaphoretic.     Vitals reviewed.     Assessment & Plan:     1. Contact dermatitis Large vesicles noted on left forearm. Advised to not pop vesicles. Cool showers, no scrubbing. May continue benadryl as needed. Depo-medrol 80mg  injection given today and well tolerated. Prednisone 6 day taper also given for her to start tomorrow. May apply hydrocortisone cream/ointment and/or calamine lotion topically for itch. She is to call if any signs of secondary infection develop.  - methylPREDNISolone acetate (DEPO-MEDROL) injection 80 mg; Inject 1 mL (80 mg total) into the muscle once.       Erika LovelessJennifer M Takeshia Wenk, PA-C  [facility]

## 2016-03-22 ENCOUNTER — Ambulatory Visit: Payer: 59 | Admitting: Physician Assistant

## 2016-04-05 ENCOUNTER — Encounter: Payer: Self-pay | Admitting: Physician Assistant

## 2016-04-05 ENCOUNTER — Ambulatory Visit (INDEPENDENT_AMBULATORY_CARE_PROVIDER_SITE_OTHER): Payer: 59 | Admitting: Physician Assistant

## 2016-04-05 ENCOUNTER — Ambulatory Visit: Payer: Self-pay | Admitting: Physician Assistant

## 2016-04-05 VITALS — BP 104/80 | HR 94 | Temp 98.2°F | Resp 16 | Wt 164.8 lb

## 2016-04-05 DIAGNOSIS — E039 Hypothyroidism, unspecified: Secondary | ICD-10-CM

## 2016-04-05 DIAGNOSIS — E668 Other obesity: Secondary | ICD-10-CM

## 2016-04-05 DIAGNOSIS — N926 Irregular menstruation, unspecified: Secondary | ICD-10-CM | POA: Diagnosis not present

## 2016-04-05 DIAGNOSIS — IMO0002 Reserved for concepts with insufficient information to code with codable children: Secondary | ICD-10-CM

## 2016-04-05 DIAGNOSIS — Z713 Dietary counseling and surveillance: Secondary | ICD-10-CM | POA: Diagnosis not present

## 2016-04-05 DIAGNOSIS — E669 Obesity, unspecified: Secondary | ICD-10-CM | POA: Diagnosis not present

## 2016-04-05 MED ORDER — PHENTERMINE HCL 37.5 MG PO CAPS: 37.5000 mg | ORAL_CAPSULE | ORAL | 3 refills | Status: DC

## 2016-04-05 NOTE — Patient Instructions (Signed)

## 2016-04-05 NOTE — Progress Notes (Signed)
Patient: Erika Hart Female    DOB: [birth year]   40 y.o.   MRN: [id number] Visit Date: 04/05/2016  Today's Provider: Margaretann Loveless, PA-C   Chief Complaint  Patient presents with  . Follow-up    Weight   Subjective:    HPI Patient is here today for 4 week follow-up weight loss counseling. Last weight was 171 lb. Today weight is 164.8 lbs. She reports exercising 3-4 times a week. She is trying to work on her dieting better. She did have a few vacations back to back that set her behind, but is trying to get back on track now.  She does also report not having a menstrual cycle since July. She has taken 3 pregnancy test which were all negative. She reports her mother going through early menopause. She reached menarche when she was in 5th grade at an early age. She denies any abnormal paps, family or personal history of uterine fibroids or ovarian cysts. No cancer history.   Wt Readings from Last 3 Encounters:  04/05/16 164 lb 12.8 oz (74.8 kg)  03/04/16 171 lb (77.6 kg)  02/16/16 165 lb 9.6 oz (75.1 kg)      Allergies  Allergen Reactions  . Sulfa Antibiotics Rash     Current Outpatient Prescriptions:  .  Aspirin-Acetaminophen-Caffeine (EXCEDRIN MIGRAINE PO), Take by mouth., Disp: , Rfl:  .  diclofenac (VOLTAREN) 75 MG EC tablet, TAKE 1 TABLET (75 MG TOTAL) BY MOUTH 2 (TWO) TIMES DAILY WITH MEALS., Disp: 60 tablet, Rfl: 6 .  hydrochlorothiazide (MICROZIDE) 12.5 MG capsule, Take 1 capsule (12.5 mg total) by mouth daily., Disp: 30 capsule, Rfl: 6 .  levothyroxine (SYNTHROID, LEVOTHROID) 137 MCG tablet, TAKE 1 TAB EVERY DAY ON AN EMPTY STOMACH WITH A GLASS OF WATER AT LEAST 30 MIN BEFORE BREAKFAST, Disp: , Rfl: 5 .  omeprazole (PRILOSEC) 20 MG capsule, Take 1 capsule (20 mg total) by mouth 2 (two) times daily before a meal., Disp: 60 capsule, Rfl: 0 .  phentermine 37.5 MG capsule, Take 1 capsule (37.5 mg total) by mouth every morning., Disp: 30 capsule, Rfl: 0 .   predniSONE (STERAPRED UNI-PAK 21 TAB) 10 MG (21) TBPK tablet, Take as directed on package directions (Patient not taking: Reported on 04/05/2016), Disp: 21 tablet, Rfl: 0  Review of Systems  Constitutional: Negative.   Respiratory: Negative.   Cardiovascular: Negative.   Gastrointestinal: Negative.   Genitourinary: Positive for menstrual problem.  Neurological: Negative.   Psychiatric/Behavioral: Positive for agitation.    Social History  Substance Use Topics  . Smoking status: Never Smoker  . Smokeless tobacco: Never Used  . Alcohol use Yes     Comment: 1-2 a month   Objective:   BP 104/80 (BP Location: Left Arm, Patient Position: Sitting, Cuff Size: Normal)   Pulse 94   Temp 98.2 F (36.8 C) (Oral)   Resp 16   Wt 164 lb 12.8 oz (74.8 kg)   BMI 30.14 kg/m   Physical Exam  Constitutional: She appears well-developed and well-nourished. No distress.  Neck: Normal range of motion. Neck supple. No tracheal deviation present. No thyromegaly present.  Cardiovascular: Normal rate, regular rhythm and normal heart sounds.  Exam reveals no gallop and no friction rub.   No murmur heard. Pulmonary/Chest: Effort normal and breath sounds normal. No respiratory distress. She has no wheezes. She has no rales.  Musculoskeletal: She exhibits no edema.  Lymphadenopathy:    She has no cervical adenopathy.  Skin: She is not diaphoretic.  Psychiatric: She has a normal mood and affect. Her behavior is normal. Judgment and thought content normal.  Vitals reviewed.     Assessment & Plan:     1. Adult BMI 30+ She is getting back on track with her weight loss goals and has increased her exercise back up. She continues to adjust to better eating habits. Will continue phentermine for appetite suppression x 3 months. I will see her back at that time for weight recheck an dher CPE with pap.  - phentermine 37.5 MG capsule; Take 1 capsule (37.5 mg total) by mouth every morning.  Dispense: 30 capsule;  Refill: 3  2. Encounter for weight loss counseling See above medical treatment plan. - phentermine 37.5 MG capsule; Take 1 capsule (37.5 mg total) by mouth every morning.  Dispense: 30 capsule; Refill: 3  3. Obese See above medical treatment plan. - phentermine 37.5 MG capsule; Take 1 capsule (37.5 mg total) by mouth every morning.  Dispense: 30 capsule; Refill: 3  4. Hypothyroidism, unspecified hypothyroidism type She has been having increased moodiness, occasional hot flash and no menstrual cycle since July. Will recheck thyroid to make sure stable as well as check FSH and LH to see if she is nearing menopause. Will f/u pending results.  - Thyroid Panel With TSH  5. Missed period See above medical treatment plan. - FSH - LH       Margaretann LovelessJennifer M Prayan Ulin, PA-C  [facility]

## 2016-04-06 LAB — THYROID PANEL WITH TSH
FREE THYROXINE INDEX: 2.6 (ref 1.2–4.9)
T3 UPTAKE RATIO: 29 % (ref 24–39)
T4, Total: 8.9 ug/dL (ref 4.5–12.0)
TSH: 0.352 u[IU]/mL — AB (ref 0.450–4.500)

## 2016-04-06 LAB — FOLLICLE STIMULATING HORMONE: FSH: 15.7 m[IU]/mL

## 2016-04-08 ENCOUNTER — Telehealth: Payer: Self-pay

## 2016-04-08 MED ORDER — LEVOTHYROXINE SODIUM 125 MCG PO TABS: 125.0000 ug | ORAL_TABLET | Freq: Every day | ORAL | 0 refills | Status: DC

## 2016-04-08 NOTE — Telephone Encounter (Signed)
Patient advised as directed below. Called the pharmacy that Levothyroxine 125 MCG is the correct prescription.  Thanks,  -Joseline

## 2016-04-08 NOTE — Addendum Note (Signed)
Addended by: Margaretann LovelessBURNETTE, Retta M on: 04/08/2016 01:32 PM   Modules accepted: Orders

## 2016-04-08 NOTE — Telephone Encounter (Signed)
-----   Message from Margaretann LovelessJennifer M Burnette, PA-C sent at 04/08/2016  1:31 PM EDT ----- [facility] and in an ovulating phase. TSH is low indicating over correction with medication. This can affect menstrual cycle as well. I will send in new Rx for lower dose of levothyroxine and we can recheck thyroid panel in 6-8 weeks.

## 2016-04-30 ENCOUNTER — Encounter: Payer: Self-pay | Admitting: Physician Assistant

## 2016-04-30 DIAGNOSIS — I10 Essential (primary) hypertension: Secondary | ICD-10-CM

## 2016-04-30 MED ORDER — HYDROCHLOROTHIAZIDE 12.5 MG PO CAPS: 12.5000 mg | ORAL_CAPSULE | Freq: Every day | ORAL | 6 refills | Status: DC

## 2016-07-05 ENCOUNTER — Encounter: Payer: 59 | Admitting: Physician Assistant

## 2016-08-16 ENCOUNTER — Other Ambulatory Visit: Payer: Self-pay | Admitting: Physician Assistant

## 2016-08-16 ENCOUNTER — Ambulatory Visit: Payer: 59 | Admitting: Physician Assistant

## 2016-08-16 ENCOUNTER — Encounter: Payer: Self-pay | Admitting: Physician Assistant

## 2016-08-16 ENCOUNTER — Ambulatory Visit (INDEPENDENT_AMBULATORY_CARE_PROVIDER_SITE_OTHER): Payer: 59 | Admitting: Physician Assistant

## 2016-08-16 VITALS — BP 100/62 | HR 86 | Temp 97.7°F | Resp 16 | Wt 171.6 lb

## 2016-08-16 DIAGNOSIS — H538 Other visual disturbances: Secondary | ICD-10-CM | POA: Diagnosis not present

## 2016-08-16 DIAGNOSIS — L720 Epidermal cyst: Secondary | ICD-10-CM | POA: Diagnosis not present

## 2016-08-16 DIAGNOSIS — E039 Hypothyroidism, unspecified: Secondary | ICD-10-CM

## 2016-08-16 NOTE — Progress Notes (Signed)
Patient: Erika Hart Female    DOB: 19-Aug-1975   41 y.o.   MRN: [id number] Visit Date: 08/16/2016  Today's Provider: Margaretann LovelessJennifer M Chaia Ikard, PA-C   Chief Complaint  Patient presents with  . Blurred Vision   Subjective:    HPI Patient is here with c/o of blurred vision for the past 10 days together with a headache. She reports that is worse on the left eye and the headache is also on the left side. She is taking her Excedrin Migraine that helps with the headache. She reports that she gets the blurred vision more when she is on her phone and or reading. No tingling or numbness. No focal neurological deficit.   She does report that when she takes the excedrin the headache improves but the blurred vision persisted. No double vision, eye redness or pain. No discharge.  She also has a nodule on her right lower extremity near the ankle. Just started noticing over the last few weeks. Tender to palpation. Moveable. No overlying skin changes. Some irritation with walking.     Allergies  Allergen Reactions  . Sulfa Antibiotics Rash     Current Outpatient Prescriptions:  .  Aspirin-Acetaminophen-Caffeine (EXCEDRIN MIGRAINE PO), Take by mouth., Disp: , Rfl:  .  hydrochlorothiazide (MICROZIDE) 12.5 MG capsule, Take 1 capsule (12.5 mg total) by mouth daily., Disp: 30 capsule, Rfl: 6 .  levothyroxine (SYNTHROID, LEVOTHROID) 125 MCG tablet, Take 1 tablet (125 mcg total) by mouth daily., Disp: 90 tablet, Rfl: 0 .  diclofenac (VOLTAREN) 75 MG EC tablet, TAKE 1 TABLET (75 MG TOTAL) BY MOUTH 2 (TWO) TIMES DAILY WITH MEALS. (Patient not taking: Reported on 08/16/2016), Disp: 60 tablet, Rfl: 6 .  omeprazole (PRILOSEC) 20 MG capsule, Take 1 capsule (20 mg total) by mouth 2 (two) times daily before a meal., Disp: 60 capsule, Rfl: 0 .  phentermine 37.5 MG capsule, Take 1 capsule (37.5 mg total) by mouth every morning. (Patient not taking: Reported on 08/16/2016), Disp: 30 capsule, Rfl: 3  Review of  Systems  Constitutional: Negative.   HENT: Negative.   Eyes: Positive for photophobia and visual disturbance (blurred). Negative for pain, discharge, redness and itching.  Respiratory: Negative for cough, chest tightness, shortness of breath and wheezing.   Cardiovascular: Positive for leg swelling (ankles). Negative for chest pain and palpitations.  Gastrointestinal: Negative.   Genitourinary: Negative.   Neurological: Negative.   Psychiatric/Behavioral: Negative.     Social History  Substance Use Topics  . Smoking status: Never Smoker  . Smokeless tobacco: Never Used  . Alcohol use Yes     Comment: 1-2 a month   Objective:   BP 100/62 (BP Location: Right Arm, Patient Position: Sitting, Cuff Size: Normal)   Pulse 86   Temp 97.7 F (36.5 C) (Oral)   Resp 16   Wt 171 lb 9.6 oz (77.8 kg)   BMI 31.39 kg/m   Physical Exam  Constitutional: She is oriented to person, place, and time. She appears well-developed and well-nourished. No distress.  Eyes: Conjunctivae, EOM and lids are normal. Pupils are equal, round, and reactive to light. Right eye exhibits no chemosis and no discharge. Left eye exhibits no chemosis and no discharge.  Handheld snellen chart used for acuity which was 20/20 bilaterally  Neck: Normal range of motion. Neck supple. No tracheal deviation present. No thyromegaly present.  Cardiovascular: Normal rate, regular rhythm and normal heart sounds.  Exam reveals no gallop and no friction rub.  No murmur heard. Pulmonary/Chest: Effort normal and breath sounds normal. No respiratory distress. She has no wheezes. She has no rales.  Lymphadenopathy:    She has no cervical adenopathy.  Neurological: She is alert and oriented to person, place, and time. She has normal strength. No cranial nerve deficit or sensory deficit. She displays a negative Romberg sign. Coordination and gait normal.  Skin: She is not diaphoretic.     Vitals reviewed.      Assessment & Plan:      1. Blurred vision, left eye Patient has never seen an ophthalmologist. I suspect visual changes as source of blurred vision that is worsening her headaches/migraines. Will place referral as below. If no visual source found as cause I will follow up and order CT head if needed.  - Ambulatory referral to Ophthalmology  2. Cyst of skin and subcutaneous tissue Small subcutaneous, moveable cyst noted on the right lower extremity just medial to the anterior tibialis muscle tendon as it crosses the ankle joint. Possible tendinopathy, but seems cystic more likely. Advised patient to use IBU as needed for pain and moist heat. She is getting ready to start exercise regimen again and I told her if symptoms become debilitating to her exercising I will refer to podiatry. Patient agrees.       Margaretann Loveless, PA-C  [facility]

## 2016-08-16 NOTE — Patient Instructions (Signed)
Blurred Vision Having blurred vision means that you cannot see things clearly. Your vision may seem fuzzy or out of focus. Blurred vision is a very common symptom of an eye or vision problem. Blurred vision is often a gradual blur that occurs in one eye or both eyes. There are many causes of blurred vision, including cataracts, macular degeneration, and diabetic retinopathy. Blurred vision can be diagnosed based on your symptoms and a physical exam. Tell your health care provider about any other health problems you have, any recent eye injury, and any prior surgeries. You may need to see a health care provider who specializes in eye problems (ophthalmologist). Your treatment depends on what is causing your blurred vision.  HOME CARE INSTRUCTIONS  Tell your health care provider about any changes in your blurred vision.  Do not drive or operate heavy machinery if your vision is blurry.  Keep all follow-up visits as directed by your health care provider. This is important. SEEK MEDICAL CARE IF:  Your symptoms get worse.  You have new symptoms.  You have trouble seeing at night.  You have trouble seeing up close or far away.  You have trouble noticing the difference between colors. SEEK IMMEDIATE MEDICAL CARE IF:  You have severe eye pain.  You have a severe headache.  You have flashing lights in your field of vision.  You have a sudden change in vision.  You have a sudden loss of vision.  You have vision change after an injury.  You notice drainage coming from your eyes.  You notice a rash around your eyes. This information is not intended to replace advice given to you by your health care provider. Make sure you discuss any questions you have with your health care provider. Document Released: 07/18/2003 Document Revised: 11/29/2014 Document Reviewed: 06/08/2014 Elsevier Interactive Patient Education  2017 Elsevier Inc.  

## 2016-09-06 ENCOUNTER — Ambulatory Visit (INDEPENDENT_AMBULATORY_CARE_PROVIDER_SITE_OTHER): Payer: 59 | Admitting: Physician Assistant

## 2016-09-06 ENCOUNTER — Encounter: Payer: Self-pay | Admitting: Physician Assistant

## 2016-09-06 VITALS — BP 100/60 | HR 88 | Temp 98.0°F | Resp 16 | Ht 62.0 in | Wt 171.0 lb

## 2016-09-06 DIAGNOSIS — Z1231 Encounter for screening mammogram for malignant neoplasm of breast: Secondary | ICD-10-CM | POA: Diagnosis not present

## 2016-09-06 DIAGNOSIS — Z13220 Encounter for screening for lipoid disorders: Secondary | ICD-10-CM | POA: Diagnosis not present

## 2016-09-06 DIAGNOSIS — E039 Hypothyroidism, unspecified: Secondary | ICD-10-CM | POA: Diagnosis not present

## 2016-09-06 DIAGNOSIS — Z0000 Encounter for general adult medical examination without abnormal findings: Secondary | ICD-10-CM | POA: Diagnosis not present

## 2016-09-06 DIAGNOSIS — Z1239 Encounter for other screening for malignant neoplasm of breast: Secondary | ICD-10-CM

## 2016-09-06 DIAGNOSIS — I10 Essential (primary) hypertension: Secondary | ICD-10-CM

## 2016-09-06 DIAGNOSIS — Z136 Encounter for screening for cardiovascular disorders: Secondary | ICD-10-CM

## 2016-09-06 DIAGNOSIS — Z114 Encounter for screening for human immunodeficiency virus [HIV]: Secondary | ICD-10-CM | POA: Diagnosis not present

## 2016-09-06 NOTE — Patient Instructions (Signed)

## 2016-09-06 NOTE — Progress Notes (Signed)
Patient: Erika Hart, Female    DOB: 1976-01-04, 41 y.o.   MRN: [id number] Visit Date: 09/06/2016  Today's Provider: Margaretann LovelessJennifer M Kannen Moxey, PA-C   Chief Complaint  Patient presents with  . Annual Exam   Subjective:    Annual physical exam Erika Hart is a 41 y.o. female who presents today for health maintenance and complete physical. She feels well. She reports exercising 3-4 times a week. She reports she is sleeping fairly well.  12/09/14 CPE 12/09/14 Pap-neg -----------------------------------------------------------------   Review of Systems  Constitutional: Positive for diaphoresis.  HENT: Negative.   Eyes: Positive for visual disturbance.  Respiratory: Negative.   Cardiovascular: Positive for leg swelling.  Gastrointestinal: Positive for abdominal distention, abdominal pain, constipation, diarrhea and nausea.  Endocrine: Positive for cold intolerance.  Genitourinary: Negative.   Musculoskeletal: Negative.   Skin: Negative.   Allergic/Immunologic: Negative.   Neurological: Positive for dizziness, light-headedness, numbness and headaches.  Hematological: Bruises/bleeds easily.  Psychiatric/Behavioral: Positive for agitation.    Social History      She  reports that she has never smoked. She has never used smokeless tobacco. She reports that she drinks alcohol. She reports that she does not use drugs.       Social History   Social History  . Marital status: Married    Spouse name: N/A  . Number of children: N/A  . Years of education: N/A   Social History Main Topics  . Smoking status: Never Smoker  . Smokeless tobacco: Never Used  . Alcohol use Yes     Comment: 1-2 a month  . Drug use: No  . Sexual activity: Not Asked   Other Topics Concern  . None   Social History Narrative  . None    Past Medical History:  Diagnosis Date  . Allergy   . Arthritis   . GERD (gastroesophageal reflux disease)   . Hypertension   . Thyroid disease       Patient Active Problem List   Diagnosis Date Noted  . Bulge of lumbar disc without myelopathy 10/20/2015  . BP (high blood pressure) 10/20/2015  . Acquired hypothyroidism 10/20/2015  . Headache, migraine 10/20/2015  . Adult BMI 30+ 10/20/2015  . Esophageal reflux 10/20/2015    Past Surgical History:  Procedure Laterality Date  . CESAREAN SECTION  2004    Family History        Family Status  Relation Status  . Mother Alive  . Father Deceased  . Brother Alive  . Daughter Alive        Her family history includes Diabetes in her father; Healthy in her daughter; Stroke in her father; Thyroid disease in her mother.     Allergies  Allergen Reactions  . Sulfa Antibiotics Rash     Current Outpatient Prescriptions:  .  Aspirin-Acetaminophen-Caffeine (EXCEDRIN MIGRAINE PO), Take by mouth., Disp: , Rfl:  .  diclofenac (VOLTAREN) 75 MG EC tablet, TAKE 1 TABLET (75 MG TOTAL) BY MOUTH 2 (TWO) TIMES DAILY WITH MEALS. (Patient taking differently: as needed. TAKE 1 TABLET (75 MG TOTAL) BY MOUTH 2 (TWO) TIMES DAILY WITH MEALS.), Disp: 60 tablet, Rfl: 6 .  hydrochlorothiazide (MICROZIDE) 12.5 MG capsule, Take 1 capsule (12.5 mg total) by mouth daily., Disp: 30 capsule, Rfl: 6 .  levothyroxine (SYNTHROID, LEVOTHROID) 125 MCG tablet, TAKE 1 TABLET (125 MCG TOTAL) BY MOUTH DAILY., Disp: 90 tablet, Rfl: 0 .  omeprazole (PRILOSEC) 20 MG capsule, Take 1 capsule (20  mg total) by mouth 2 (two) times daily before a meal., Disp: 60 capsule, Rfl: 0 .  phentermine 37.5 MG capsule, Take 1 capsule (37.5 mg total) by mouth every morning. (Patient not taking: Reported on 08/16/2016), Disp: 30 capsule, Rfl: 3   Patient Care Team: Margaretann Loveless, PA-C as PCP - General (Family Medicine)      Objective:   Vitals: BP 100/60 (BP Location: Left Arm, Patient Position: Sitting, Cuff Size: Large)   Pulse 88   Temp 98 F (36.7 C) (Oral)   Resp 16   Ht 5\' 2"  (1.575 m)   Wt 171 lb (77.6 kg)   LMP  08/23/2016 (Exact Date)   BMI 31.28 kg/m    Physical Exam  Constitutional: She is oriented to person, place, and time. She appears well-developed and well-nourished. No distress.  HENT:  Head: Normocephalic and atraumatic.  Right Ear: Hearing, tympanic membrane, external ear and ear canal normal.  Left Ear: Hearing, tympanic membrane, external ear and ear canal normal.  Nose: Nose normal.  Mouth/Throat: Uvula is midline, oropharynx is clear and moist and mucous membranes are normal. No oropharyngeal exudate.  Eyes: Conjunctivae and EOM are normal. Pupils are equal, round, and reactive to light. Right eye exhibits no discharge. Left eye exhibits no discharge. No scleral icterus.  Neck: Normal range of motion. Neck supple. No tracheal deviation present. No thyromegaly present.  Cardiovascular: Normal rate, regular rhythm, normal heart sounds and intact distal pulses.  Exam reveals no gallop and no friction rub.   No murmur heard. Pulmonary/Chest: Effort normal and breath sounds normal. No respiratory distress. She has no wheezes. She has no rales. She exhibits no tenderness. Right breast exhibits no inverted nipple, no mass, no nipple discharge, no skin change and no tenderness. Left breast exhibits no inverted nipple, no mass, no nipple discharge, no skin change and no tenderness. Breasts are symmetrical.  Abdominal: Soft. Bowel sounds are normal. She exhibits no distension and no mass. There is no tenderness. There is no rebound and no guarding.  Musculoskeletal: Normal range of motion. She exhibits no edema or tenderness.  Lymphadenopathy:    She has no cervical adenopathy.  Neurological: She is alert and oriented to person, place, and time.  Skin: Skin is warm and dry. No rash noted. She is not diaphoretic.  Psychiatric: She has a normal mood and affect. Her behavior is normal. Judgment and thought content normal.  Vitals reviewed.    Depression Screen PHQ 2/9 Scores 09/06/2016  PHQ - 2  Score 0      Assessment & Plan:     Routine Health Maintenance and Physical Exam  Exercise Activities and Dietary recommendations Goals    None      Immunization History  Administered Date(s) Administered  . Tdap 02/09/2008    Health Maintenance  Topic Date Due  . HIV Screening  03/23/1989  . PAP SMEAR  03/24/1995  . INFLUENZA VACCINE  05/06/2017 (Originally 02/27/2016)  . TETANUS/TDAP  02/08/2018     Discussed health benefits of physical activity, and encouraged her to engage in regular exercise appropriate for her age and condition.    1. Annual physical exam Normal physical exam today. Will check labs as below and f/u pending lab results. If labs are stable and WNL she will not need to have these rechecked for one year at her next annual physical exam. She is to call the office in the meantime if she has any acute issue, questions or concerns. -  CBC w/Diff/Platelet - Comprehensive Metabolic Panel (CMET)  2. Screening for breast cancer Breast exam today was normal. There is no family history of breast cancer. She does perform regular self breast exams. Mammogram was ordered as below. Information for [facility] was given to patient so she may schedule her mammogram at her convenience. - MM Digital Screening; Future  3. Essential hypertension Stable. Continue HCTZ 12.5mg  daily. Will check labs as below and f/u pending results. - CBC w/Diff/Platelet - Comprehensive Metabolic Panel (CMET)  4. Acquired hypothyroidism Stable. Continue levothyroxine daily. Will check labs as below and f/u pending results. - Thyroid Panel With TSH  5. Encounter for lipid screening for cardiovascular disease Will check labs as below and f/u pending results. - Lipid Profile  6. Screening for HIV without presence of risk factors - HIV antibody (with reflex)  --------------------------------------------------------------------    Margaretann Loveless, PA-C    [facility]

## 2016-09-13 DIAGNOSIS — Z0000 Encounter for general adult medical examination without abnormal findings: Secondary | ICD-10-CM | POA: Diagnosis not present

## 2016-09-13 DIAGNOSIS — I10 Essential (primary) hypertension: Secondary | ICD-10-CM | POA: Diagnosis not present

## 2016-09-13 DIAGNOSIS — E039 Hypothyroidism, unspecified: Secondary | ICD-10-CM | POA: Diagnosis not present

## 2016-09-14 LAB — COMPREHENSIVE METABOLIC PANEL
A/G RATIO: 1.4 (ref 1.2–2.2)
ALK PHOS: 50 IU/L (ref 39–117)
ALT: 18 IU/L (ref 0–32)
AST: 16 IU/L (ref 0–40)
Albumin: 4.1 g/dL (ref 3.5–5.5)
BILIRUBIN TOTAL: 0.9 mg/dL (ref 0.0–1.2)
BUN/Creatinine Ratio: 20 (ref 9–23)
BUN: 18 mg/dL (ref 6–24)
CHLORIDE: 101 mmol/L (ref 96–106)
CO2: 25 mmol/L (ref 18–29)
Calcium: 9.3 mg/dL (ref 8.7–10.2)
Creatinine, Ser: 0.92 mg/dL (ref 0.57–1.00)
GFR calc Af Amer: 89 mL/min/{1.73_m2} (ref >59)
GFR calc non Af Amer: 77 mL/min/{1.73_m2} (ref >59)
GLOBULIN, TOTAL: 2.9 g/dL (ref 1.5–4.5)
Glucose: 86 mg/dL (ref 65–99)
POTASSIUM: 4.2 mmol/L (ref 3.5–5.2)
SODIUM: 141 mmol/L (ref 134–144)
Total Protein: 7 g/dL (ref 6.0–8.5)

## 2016-09-14 LAB — THYROID PANEL WITH TSH
Free Thyroxine Index: 2.3 (ref 1.2–4.9)
T3 UPTAKE RATIO: 26 % (ref 24–39)
T4 TOTAL: 8.8 ug/dL (ref 4.5–12.0)
TSH: 1.19 u[IU]/mL (ref 0.450–4.500)

## 2016-09-14 LAB — LIPID PANEL
CHOL/HDL RATIO: 2.3 ratio (ref 0.0–4.4)
Cholesterol, Total: 131 mg/dL (ref 100–199)
HDL: 57 mg/dL (ref >39)
LDL CALC: 62 mg/dL (ref 0–99)
TRIGLYCERIDES: 59 mg/dL (ref 0–149)
VLDL Cholesterol Cal: 12 mg/dL (ref 5–40)

## 2016-09-14 LAB — CBC WITH DIFFERENTIAL/PLATELET
BASOS: 1 %
Basophils Absolute: 0 10*3/uL (ref 0.0–0.2)
EOS (ABSOLUTE): 0.1 10*3/uL (ref 0.0–0.4)
EOS: 2 %
HEMATOCRIT: 40.5 % (ref 34.0–46.6)
Hemoglobin: 13.7 g/dL (ref 11.1–15.9)
IMMATURE GRANULOCYTES: 0 %
Immature Grans (Abs): 0 10*3/uL (ref 0.0–0.1)
LYMPHS ABS: 1.4 10*3/uL (ref 0.7–3.1)
Lymphs: 25 %
MCH: 30.9 pg (ref 26.6–33.0)
MCHC: 33.8 g/dL (ref 31.5–35.7)
MCV: 91 fL (ref 79–97)
MONOS ABS: 0.4 10*3/uL (ref 0.1–0.9)
Monocytes: 7 %
NEUTROS ABS: 3.7 10*3/uL (ref 1.4–7.0)
NEUTROS PCT: 65 %
PLATELETS: 286 10*3/uL (ref 150–379)
RBC: 4.44 x10E6/uL (ref 3.77–5.28)
RDW: 12.9 % (ref 12.3–15.4)
WBC: 5.7 10*3/uL (ref 3.4–10.8)

## 2016-09-14 LAB — HIV ANTIBODY (ROUTINE TESTING W REFLEX): HIV Screen 4th Generation wRfx: NONREACTIVE

## 2016-10-04 ENCOUNTER — Ambulatory Visit
Admission: RE | Admit: 2016-10-04 | Discharge: 2016-10-04 | Disposition: A | Discharge status: Home / Self Care | Payer: 59 | Source: Ambulatory Visit | Attending: Physician Assistant | Admitting: Physician Assistant

## 2016-10-04 DIAGNOSIS — Z1239 Encounter for other screening for malignant neoplasm of breast: Secondary | ICD-10-CM

## 2016-10-04 DIAGNOSIS — Z1231 Encounter for screening mammogram for malignant neoplasm of breast: Secondary | ICD-10-CM | POA: Insufficient documentation

## 2016-10-09 ENCOUNTER — Other Ambulatory Visit: Payer: Self-pay | Admitting: *Deleted

## 2016-10-09 ENCOUNTER — Inpatient Hospital Stay
Admission: RE | Admit: 2016-10-09 | Discharge: 2016-10-09 | Disposition: A | Discharge status: Home / Self Care | Payer: Self-pay | Source: Ambulatory Visit | Attending: *Deleted | Admitting: *Deleted

## 2016-10-09 DIAGNOSIS — Z9289 Personal history of other medical treatment: Secondary | ICD-10-CM

## 2016-10-11 ENCOUNTER — Ambulatory Visit: Payer: Self-pay | Admitting: Physician Assistant

## 2016-10-11 ENCOUNTER — Other Ambulatory Visit: Payer: Self-pay | Admitting: Physician Assistant

## 2016-10-11 DIAGNOSIS — N6489 Other specified disorders of breast: Secondary | ICD-10-CM

## 2016-10-11 DIAGNOSIS — R928 Other abnormal and inconclusive findings on diagnostic imaging of breast: Secondary | ICD-10-CM

## 2016-10-25 ENCOUNTER — Telehealth: Payer: Self-pay

## 2016-10-25 ENCOUNTER — Ambulatory Visit
Admission: RE | Admit: 2016-10-25 | Discharge: 2016-10-25 | Disposition: A | Discharge status: Home / Self Care | Payer: 59 | Source: Ambulatory Visit | Attending: Physician Assistant | Admitting: Physician Assistant

## 2016-10-25 DIAGNOSIS — N6489 Other specified disorders of breast: Secondary | ICD-10-CM | POA: Insufficient documentation

## 2016-10-25 DIAGNOSIS — R928 Other abnormal and inconclusive findings on diagnostic imaging of breast: Secondary | ICD-10-CM

## 2016-10-25 NOTE — Telephone Encounter (Signed)
Pt advised.   Thanks,   -Laura  

## 2016-10-25 NOTE — Telephone Encounter (Signed)
-----   Message from Margaretann Loveless, [place] sent at 10/25/2016 10:58 AM EDT ----- Repeat diagnostic mammogram showed normal fibroglandular tissue as the cause of asymmetry noted. May proceed with regular normal mammogram screenings next year.

## 2016-11-14 ENCOUNTER — Other Ambulatory Visit: Payer: Self-pay | Admitting: Physician Assistant

## 2016-11-14 DIAGNOSIS — E039 Hypothyroidism, unspecified: Secondary | ICD-10-CM

## 2016-11-25 ENCOUNTER — Other Ambulatory Visit: Payer: Self-pay

## 2016-11-25 DIAGNOSIS — I10 Essential (primary) hypertension: Secondary | ICD-10-CM

## 2016-11-25 MED ORDER — HYDROCHLOROTHIAZIDE 12.5 MG PO CAPS: 12.5000 mg | ORAL_CAPSULE | Freq: Every day | ORAL | 1 refills | Status: DC

## 2016-11-25 NOTE — Telephone Encounter (Signed)
90 days supply: Request For Authorization.  Thanks,  -Harrell Niehoff

## 2016-12-09 ENCOUNTER — Ambulatory Visit (INDEPENDENT_AMBULATORY_CARE_PROVIDER_SITE_OTHER): Payer: 59 | Admitting: Physician Assistant

## 2016-12-09 ENCOUNTER — Encounter: Payer: Self-pay | Admitting: Physician Assistant

## 2016-12-09 VITALS — BP 120/72 | HR 72 | Temp 98.0°F | Resp 16 | Wt 177.0 lb

## 2016-12-09 DIAGNOSIS — L298 Other pruritus: Secondary | ICD-10-CM | POA: Diagnosis not present

## 2016-12-09 DIAGNOSIS — T50905A Adverse effect of unspecified drugs, medicaments and biological substances, initial encounter: Secondary | ICD-10-CM

## 2016-12-09 DIAGNOSIS — T887XXA Unspecified adverse effect of drug or medicament, initial encounter: Secondary | ICD-10-CM | POA: Diagnosis not present

## 2016-12-09 MED ORDER — PREDNISONE 10 MG (21) PO TBPK: ORAL_TABLET | ORAL | 0 refills | Status: DC

## 2016-12-09 MED ORDER — HYDROXYZINE HCL 25 MG PO TABS: 25.0000 mg | ORAL_TABLET | Freq: Three times a day (TID) | ORAL | 0 refills | Status: DC | PRN

## 2016-12-09 MED ORDER — METHYLPREDNISOLONE ACETATE 80 MG/ML IJ SUSP
80.0000 mg | Freq: Once | INTRAMUSCULAR | Status: AC
  Administered 2016-12-09: 80 mg via INTRAMUSCULAR

## 2016-12-09 NOTE — Patient Instructions (Signed)
Drug Rash A drug rash is a change in the color or texture of the skin that is caused by a drug. It can develop minutes, hours, or days after the person takes the drug. What are the causes? This condition is usually caused by a drug allergy. It can also be caused by exposure to sunlight after taking a drug that makes the skin sensitive to light. Drugs that commonly cause rashes include:  Penicillin.  Antibiotic medicines.  Medicines that treat seizures.  Medicines that treat cancer (chemotherapy).  Aspirin and other nonsteroidal anti-inflammatory drugs (NSAIDs).  Injectable dyes that contain iodine.  Insulin. What are the signs or symptoms? Symptoms of this condition include:  Redness.  Tiny bumps.  Peeling.  Itching.  Itchy welts (hives).  Swelling. The rash may appear on a small area of skin or all over the body. How is this diagnosed? To diagnose the condition, your health care provider will do a physical exam. He or she may also order tests to find out which drug caused the rash. Tests to find the cause of a rash include:  Skin tests.  Blood tests.  Drug challenge. For this test, you stop taking all of the drugs that you do not need to take, and then you start taking them again by adding back one of the drugs at a time. How is this treated? A drug rash may be treated with medicines, including:  Antihistamines. These may be given to relieve itching.  An NSAID. This may be given to reduce swelling and treat pain.  A steroid drug. This may be given to reduce swelling. The rash usually goes away when the person stops taking the drug that caused it. Follow these instructions at home:  Take medicines only as directed by your health care provider.  Let all of your health care providers know about any drug reactions you have had in the past.  If you have hives, take a cool shower or use a cool compress to relieve itchiness. Contact a health care provider if:  You  have a fever.  Your rash is not going away.  Your rash gets worse.  Your rash comes back.  You have wheezing or coughing. Get help right away if:  You start to have breathing problems.  You start to have shortness of breath.  You face or throat starts to swell.  You have severe weakness with dizziness or fainting.  You have chest pain. This information is not intended to replace advice given to you by your health care provider. Make sure you discuss any questions you have with your health care provider. Document Released: 08/22/2004 Document Revised: 12/21/2015 Document Reviewed: 05/11/2014 Elsevier Interactive Patient Education  2017 [employer]

## 2016-12-09 NOTE — Progress Notes (Signed)
Patient: Erika SettleJennifer A Hart Female    DOB: [birth year]   41 y.o.   MRN: [id number] Visit Date: 12/09/2016  Today's Provider: Margaretann LovelessJennifer M Daylah Sayavong, PA-C   Chief Complaint  Patient presents with  . Rash   Subjective:    HPI  Patient here today C/O rash allover body since yesterday. Patient reports rash is itchy and denies pain. Patient reports she was out during the weekend and was exposed to the sun for many hours. Patient concerned that it may be a reaction from HCTZ while in the sun.     Allergies  Allergen Reactions  . Sulfa Antibiotics Rash     Current Outpatient Prescriptions:  .  Aspirin-Acetaminophen-Caffeine (EXCEDRIN MIGRAINE PO), Take by mouth., Disp: , Rfl:  .  diclofenac (VOLTAREN) 75 MG EC tablet, TAKE 1 TABLET (75 MG TOTAL) BY MOUTH 2 (TWO) TIMES DAILY WITH MEALS. (Patient taking differently: as needed. TAKE 1 TABLET (75 MG TOTAL) BY MOUTH 2 (TWO) TIMES DAILY WITH MEALS.), Disp: 60 tablet, Rfl: 6 .  hydrochlorothiazide (MICROZIDE) 12.5 MG capsule, Take 1 capsule (12.5 mg total) by mouth daily., Disp: 90 capsule, Rfl: 1 .  levothyroxine (SYNTHROID, LEVOTHROID) 125 MCG tablet, TAKE 1 TABLET (125 MCG TOTAL) BY MOUTH DAILY., Disp: 90 tablet, Rfl: 1 .  omeprazole (PRILOSEC) 20 MG capsule, Take 1 capsule (20 mg total) by mouth 2 (two) times daily before a meal., Disp: 60 capsule, Rfl: 0 .  phentermine 37.5 MG capsule, Take 1 capsule (37.5 mg total) by mouth every morning. (Patient not taking: Reported on 08/16/2016), Disp: 30 capsule, Rfl: 3  Review of Systems  Constitutional: Negative.   HENT: Negative for sore throat and trouble swallowing.   Respiratory: Negative.   Cardiovascular: Negative.   Gastrointestinal: Negative.   Skin: Positive for rash.  Neurological: Negative.     Social History  Substance Use Topics  . Smoking status: Never Smoker  . Smokeless tobacco: Never Used  . Alcohol use Yes     Comment: 1-2 a month   Objective:   There were no vitals  taken for this visit. There were no vitals filed for this visit.   Physical Exam  Constitutional: She appears well-developed and well-nourished. No distress.  Neck: Normal range of motion. Neck supple.  Cardiovascular: Normal rate, regular rhythm and normal heart sounds.  Exam reveals no gallop and no friction rub.   No murmur heard. Pulmonary/Chest: Effort normal and breath sounds normal. No respiratory distress. She has no wheezes. She has no rales.  Skin: Rash noted. Rash is papular (diffuse fine papule rash located on upper extremities, chest, abdomen and legs; very pruritic). She is not diaphoretic.  Vitals reviewed.      Assessment & Plan:     1. Adverse effect of drug, initial encounter Depomedrol 80mg  given IM today in the office. 6 day prednisone taper given for her to start tomorrow if needed. Hydroxyzine given for itching. She is to call if no improvement or symptoms worsen. - methylPREDNISolone acetate (DEPO-MEDROL) injection 80 mg; Inject 1 mL (80 mg total) into the muscle once. - predniSONE (STERAPRED UNI-PAK 21 TAB) 10 MG (21) TBPK tablet; Take as directed on package instructions  Dispense: 21 tablet; Refill: 0  2. Itching due to drug See above medical treatment plan. - hydrOXYzine (ATARAX/VISTARIL) 25 MG tablet; Take 1 tablet (25 mg total) by mouth 3 (three) times daily as needed.  Dispense: 30 tablet; Refill: 0       Victorino DikeJennifer  Dorothy Puffer, PA-C  [facility]

## 2017-03-21 ENCOUNTER — Encounter: Payer: Self-pay | Admitting: Physician Assistant

## 2017-03-24 ENCOUNTER — Ambulatory Visit (INDEPENDENT_AMBULATORY_CARE_PROVIDER_SITE_OTHER): Payer: 59 | Admitting: Physician Assistant

## 2017-03-24 ENCOUNTER — Encounter: Payer: Self-pay | Admitting: Physician Assistant

## 2017-03-24 VITALS — BP 110/82 | HR 78 | Temp 98.4°F | Resp 16 | Ht 62.0 in | Wt 177.0 lb

## 2017-03-24 DIAGNOSIS — R5383 Other fatigue: Secondary | ICD-10-CM

## 2017-03-24 DIAGNOSIS — E039 Hypothyroidism, unspecified: Secondary | ICD-10-CM

## 2017-03-24 DIAGNOSIS — R1011 Right upper quadrant pain: Secondary | ICD-10-CM | POA: Diagnosis not present

## 2017-03-24 DIAGNOSIS — G44229 Chronic tension-type headache, not intractable: Secondary | ICD-10-CM

## 2017-03-24 DIAGNOSIS — E161 Other hypoglycemia: Secondary | ICD-10-CM

## 2017-03-24 DIAGNOSIS — R1013 Epigastric pain: Secondary | ICD-10-CM | POA: Diagnosis not present

## 2017-03-24 DIAGNOSIS — Z833 Family history of diabetes mellitus: Secondary | ICD-10-CM

## 2017-03-24 DIAGNOSIS — N926 Irregular menstruation, unspecified: Secondary | ICD-10-CM

## 2017-03-24 NOTE — Patient Instructions (Signed)
Tension Headache A tension headache is a feeling of pain, pressure, or aching that is often felt over the front and sides of the head. The pain can be dull, or it can feel tight (constricting). Tension headaches are not normally associated with nausea or vomiting, and they do not get worse with physical activity. Tension headaches can last from 30 minutes to several days. This is the most common type of headache. CAUSES The exact cause of this condition is not known. Tension headaches often begin after stress, anxiety, or depression. Other triggers may include:  Alcohol.  Too much caffeine, or caffeine withdrawal.  Respiratory infections, such as colds, flu, or sinus infections.  Dental problems or teeth clenching.  Fatigue.  Holding your head and neck in the same position for a long period of time, such as while using a computer.  Smoking. SYMPTOMS Symptoms of this condition include:  A feeling of pressure around the head.  Dull, aching head pain.  Pain felt over the front and sides of the head.  Tenderness in the muscles of the head, neck, and shoulders. DIAGNOSIS This condition may be diagnosed based on your symptoms and a physical exam. Tests may be done, such as a CT scan or an MRI of your head. These tests may be done if your symptoms are severe or unusual. TREATMENT This condition may be treated with lifestyle changes and medicines to help relieve symptoms. HOME CARE INSTRUCTIONS Managing Pain  Take over-the-counter and prescription medicines only as told by your health care provider.  Lie down in a dark, quiet room when you have a headache.  If directed, apply ice to the head and neck area: ? Put ice in a plastic bag. ? Place a towel between your skin and the bag. ? Leave the ice on for 20 minutes, 2-3 times per day.  Use a heating pad or a hot shower to apply heat to the head and neck area as told by your health care provider. Eating and Drinking  Eat meals on  a regular schedule.  Limit alcohol use.  Decrease your caffeine intake, or stop using caffeine. General Instructions  Keep all follow-up visits as told by your health care provider. This is important.  Keep a headache journal to help find out what may trigger your headaches. For example, write down: ? What you eat and drink. ? How much sleep you get. ? Any change to your diet or medicines.  Try massage or other relaxation techniques.  Limit stress.  Sit up straight, and avoid tensing your muscles.  Do not use tobacco products, including cigarettes, chewing tobacco, or e-cigarettes. If you need help quitting, ask your health care provider.  Exercise regularly as told by your health care provider.  Get 7-9 hours of sleep, or the amount recommended by your health care provider. SEEK MEDICAL CARE IF:  Your symptoms are not helped by medicine.  You have a headache that is different from what you normally experience.  You have nausea or you vomit.  You have a fever. SEEK IMMEDIATE MEDICAL CARE IF:  Your headache becomes severe.  You have repeated vomiting.  You have a stiff neck.  You have a loss of vision.  You have problems with speech.  You have pain in your eye or ear.  You have muscular weakness or loss of muscle control.  You lose your balance or you have trouble walking.  You feel faint or you pass out.  You have confusion. This information   is not intended to replace advice given to you by your health care provider. Make sure you discuss any questions you have with your health care provider. Document Released: 07/15/2005 Document Revised: 04/05/2015 Document Reviewed: 11/07/2014 Elsevier Interactive Patient Education  2017 Elsevier Inc. Abdominal Pain, Adult Abdominal pain can be caused by many things. Often, abdominal pain is not serious and it gets better with no treatment or by being treated at home. However, sometimes abdominal pain is serious. Your  health care provider will do a medical history and a physical exam to try to determine the cause of your abdominal pain. Follow these instructions at home:  Take over-the-counter and prescription medicines only as told by your health care provider. Do not take a laxative unless told by your health care provider.  Drink enough fluid to keep your urine clear or pale yellow.  Watch your condition for any changes.  Keep all follow-up visits as told by your health care provider. This is important. Contact a health care provider if:  Your abdominal pain changes or gets worse.  You are not hungry or you lose weight without trying.  You are constipated or have diarrhea for more than 2-3 days.  You have pain when you urinate or have a bowel movement.  Your abdominal pain wakes you up at night.  Your pain gets worse with meals, after eating, or with certain foods.  You are throwing up and cannot keep anything down.  You have a fever. Get help right away if:  Your pain does not go away as soon as your health care provider told you to expect.  You cannot stop throwing up.  Your pain is only in areas of the abdomen, such as the right side or the left lower portion of the abdomen.  You have bloody or black stools, or stools that look like tar.  You have severe pain, cramping, or bloating in your abdomen.  You have signs of dehydration, such as: ? Dark urine, very little urine, or no urine. ? Cracked lips. ? Dry mouth. ? Sunken eyes. ? Sleepiness. ? Weakness. This information is not intended to replace advice given to you by your health care provider. Make sure you discuss any questions you have with your health care provider. Document Released: 04/24/2005 Document Revised: 02/02/2016 Document Reviewed: 12/27/2015 Elsevier Interactive Patient Education  2017 [employer].

## 2017-03-24 NOTE — Progress Notes (Signed)
Patient: Erika Hart Female    DOB: [birth year]   41 y.o.   MRN: [id number] Visit Date: 03/24/2017  Today's Provider: Margaretann Loveless, PA-C   Chief Complaint  Patient presents with  . Abdominal Pain   Subjective:    HPI Abdominal Pain: Patient complains of abdominal pain. The pain is described as sharp and bloating. Pain is located in the diffusely without radiation. Onset was several weeks ago. Symptoms have been gradually worsening since. Aggravating factors: none.  Alleviating factors: antacids. Associated symptoms: headache and nausea. The patient denies vomiting.  Patient reports "shaking" on and off for several weeks. Patient reports symptoms feel like her sugar is dropping. Patient reports symptoms are present before eating and resolves soon after she eats.   Patient reports having irregular periods x's 2 years, last period this year was in July. Patient reports that she has taken pregnancy test and have been negative.    Allergies  Allergen Reactions  . Sulfa Antibiotics Rash     Current Outpatient Prescriptions:  .  Aspirin-Acetaminophen-Caffeine (EXCEDRIN MIGRAINE PO), Take by mouth., Disp: , Rfl:  .  diclofenac (VOLTAREN) 75 MG EC tablet, TAKE 1 TABLET (75 MG TOTAL) BY MOUTH 2 (TWO) TIMES DAILY WITH MEALS. (Patient taking differently: as needed. TAKE 1 TABLET (75 MG TOTAL) BY MOUTH 2 (TWO) TIMES DAILY WITH MEALS.), Disp: 60 tablet, Rfl: 6 .  hydrochlorothiazide (MICROZIDE) 12.5 MG capsule, Take 1 capsule (12.5 mg total) by mouth daily., Disp: 90 capsule, Rfl: 1 .  levothyroxine (SYNTHROID, LEVOTHROID) 125 MCG tablet, TAKE 1 TABLET (125 MCG TOTAL) BY MOUTH DAILY., Disp: 90 tablet, Rfl: 1 .  omeprazole (PRILOSEC) 20 MG capsule, Take 1 capsule (20 mg total) by mouth 2 (two) times daily before a meal., Disp: 60 capsule, Rfl: 0  Review of Systems  Constitutional: Negative.   Respiratory: Negative.   Cardiovascular: Negative.   Gastrointestinal: Positive  for abdominal pain and nausea.  Musculoskeletal: Positive for myalgias and neck pain. Negative for neck stiffness.  Neurological: Positive for headaches. Negative for dizziness and numbness.    Social History  Substance Use Topics  . Smoking status: Never Smoker  . Smokeless tobacco: Never Used  . Alcohol use Yes     Comment: 1-2 a month   Objective:   BP 110/82 (BP Location: Left Arm, Patient Position: Sitting, Cuff Size: Large)   Pulse 78   Temp 98.4 F (36.9 C) (Oral)   Resp 16   Ht 5\' 2"  (1.575 m)   Wt 177 lb (80.3 kg)   SpO2 99%   BMI 32.37 kg/m  Vitals:   03/24/17 0812  BP: 110/82  Pulse: 78  Resp: 16  Temp: 98.4 F (36.9 C)  TempSrc: Oral  SpO2: 99%  Weight: 177 lb (80.3 kg)  Height: 5\' 2"  (1.575 m)     Physical Exam  Constitutional: She is oriented to person, place, and time. She appears well-developed and well-nourished. No distress.  Neck: Normal range of motion. Neck supple. No thyromegaly present.  Cardiovascular: Normal rate, regular rhythm and normal heart sounds.  Exam reveals no gallop and no friction rub.   No murmur heard. Pulmonary/Chest: Effort normal and breath sounds normal. No respiratory distress. She has no wheezes. She has no rales.  Abdominal: Soft. Normal appearance and bowel sounds are normal. She exhibits no distension and no mass. There is no hepatosplenomegaly. There is tenderness in the right upper quadrant and epigastric area. There is no rebound,  no guarding and no CVA tenderness.  Lymphadenopathy:    She has no cervical adenopathy.  Neurological: She is alert and oriented to person, place, and time.  Skin: Skin is warm and dry. She is not diaphoretic.  Vitals reviewed.       Assessment & Plan:     1. RUQ pain DDx: GERD, gastritis, ulcer, cholelithiasis/cholecystitis. I will check labs as below and f/u pending results. RUQ [place] also ordered. Patient does report that omeprazole does help when she takes it. Advised her to start  omeprazole back and take daily x 3 months. Also restart probiotics. I will f/u with patient and see how she is feeling.  - CBC w/Diff/Platelet - Comprehensive Metabolic Panel (CMET) - HgB A1c - US Abdomen Limited RUQ; Future  2. Epigastric pain See above medical treatment plan. - CBC w/Diff/Platelet - Comprehensive Metabolic Panel (CMET) - HgB A1c - US Abdomen Limited RUQ; Future  3. Irregular menses Will check labs as below. Suspect perimenopasual. Mother went through menopause at 62-47 yr old. Will continue to monitor. Discussed safe sex practices to prevent pregnancy. - TSH  4. Chronic tension-type headache, not intractable Headache over left posterior neck, left side occipital region and can radiate to the left eye. Discussed stress management, yoga, chiropractor, massages.  - CBC w/Diff/Platelet - Comprehensive Metabolic Panel (CMET)  5. Acquired hypothyroidism Will check labs as below and f/u pending results. - TSH  6. Hypoglycemic reaction Will check labs as below. Discussed eating smaller frequent meals.  - HgB A1c  7. Family history of diabetes mellitus Will check labs as below and f/u pending results. - HgB A1c  8. Fatigue, unspecified type Will check labs as below and f/u pending results. - Vitamin D (25 hydroxy)       Margaretann Loveless, PA-C  [facility]

## 2017-03-25 ENCOUNTER — Encounter: Payer: Self-pay | Admitting: Physician Assistant

## 2017-03-25 LAB — CBC WITH DIFFERENTIAL/PLATELET
Basophils Absolute: 0 10*3/uL (ref 0.0–0.2)
Basos: 1 %
EOS (ABSOLUTE): 0.1 10*3/uL (ref 0.0–0.4)
Eos: 2 %
Hematocrit: 40 % (ref 34.0–46.6)
Hemoglobin: 14.1 g/dL (ref 11.1–15.9)
IMMATURE GRANS (ABS): 0 10*3/uL (ref 0.0–0.1)
Immature Granulocytes: 0 %
LYMPHS ABS: 1.2 10*3/uL (ref 0.7–3.1)
LYMPHS: 23 %
MCH: 31.4 pg (ref 26.6–33.0)
MCHC: 35.3 g/dL (ref 31.5–35.7)
MCV: 89 fL (ref 79–97)
MONOS ABS: 0.5 10*3/uL (ref 0.1–0.9)
Monocytes: 9 %
NEUTROS ABS: 3.2 10*3/uL (ref 1.4–7.0)
Neutrophils: 65 %
PLATELETS: 267 10*3/uL (ref 150–379)
RBC: 4.49 x10E6/uL (ref 3.77–5.28)
RDW: 13.5 % (ref 12.3–15.4)
WBC: 5 10*3/uL (ref 3.4–10.8)

## 2017-03-25 LAB — COMPREHENSIVE METABOLIC PANEL
A/G RATIO: 1.6 (ref 1.2–2.2)
ALT: 28 IU/L (ref 0–32)
AST: 33 IU/L (ref 0–40)
Albumin: 4.2 g/dL (ref 3.5–5.5)
Alkaline Phosphatase: 57 IU/L (ref 39–117)
BUN/Creatinine Ratio: 13 (ref 9–23)
BUN: 12 mg/dL (ref 6–24)
Bilirubin Total: 1 mg/dL (ref 0.0–1.2)
CALCIUM: 9.8 mg/dL (ref 8.7–10.2)
CO2: 25 mmol/L (ref 20–29)
CREATININE: 0.9 mg/dL (ref 0.57–1.00)
Chloride: 100 mmol/L (ref 96–106)
GFR, EST AFRICAN AMERICAN: 91 mL/min/{1.73_m2} (ref >59)
GFR, EST NON AFRICAN AMERICAN: 79 mL/min/{1.73_m2} (ref >59)
Globulin, Total: 2.6 g/dL (ref 1.5–4.5)
Glucose: 92 mg/dL (ref 65–99)
POTASSIUM: 4.3 mmol/L (ref 3.5–5.2)
Sodium: 140 mmol/L (ref 134–144)
TOTAL PROTEIN: 6.8 g/dL (ref 6.0–8.5)

## 2017-03-25 LAB — HEMOGLOBIN A1C
ESTIMATED AVERAGE GLUCOSE: 94 mg/dL
Hgb A1c MFr Bld: 4.9 % (ref 4.8–5.6)

## 2017-03-25 LAB — VITAMIN D 25 HYDROXY (VIT D DEFICIENCY, FRACTURES): VIT D 25 HYDROXY: 26.1 ng/mL — AB (ref 30.0–100.0)

## 2017-03-25 LAB — TSH: TSH: 0.823 u[IU]/mL (ref 0.450–4.500)

## 2017-03-28 ENCOUNTER — Ambulatory Visit: Payer: 59

## 2017-04-11 ENCOUNTER — Ambulatory Visit: Payer: 59

## 2017-04-29 ENCOUNTER — Telehealth: Payer: Self-pay | Admitting: Physician Assistant

## 2017-04-29 NOTE — Telephone Encounter (Signed)
See below-Hennessey Cantrell V Mikeala Girdler, RMA  

## 2017-04-29 NOTE — Telephone Encounter (Signed)
FYI--Pt cancelled appointment for abdominal ultrasound

## 2017-04-30 NOTE — Telephone Encounter (Signed)
Noted  

## 2017-05-12 ENCOUNTER — Other Ambulatory Visit: Payer: Self-pay | Admitting: Physician Assistant

## 2017-05-12 DIAGNOSIS — I10 Essential (primary) hypertension: Secondary | ICD-10-CM

## 2017-06-15 ENCOUNTER — Other Ambulatory Visit: Payer: Self-pay | Admitting: Physician Assistant

## 2017-06-15 DIAGNOSIS — E039 Hypothyroidism, unspecified: Secondary | ICD-10-CM

## 2017-08-15 ENCOUNTER — Encounter: Payer: Self-pay | Admitting: Physician Assistant

## 2017-08-15 ENCOUNTER — Ambulatory Visit: Payer: Self-pay | Admitting: Physician Assistant

## 2017-08-15 ENCOUNTER — Ambulatory Visit: Payer: 59 | Admitting: Physician Assistant

## 2017-08-15 VITALS — BP 96/70 | HR 84 | Temp 98.4°F | Resp 16 | Ht 62.0 in | Wt 183.0 lb

## 2017-08-15 DIAGNOSIS — E559 Vitamin D deficiency, unspecified: Secondary | ICD-10-CM

## 2017-08-15 DIAGNOSIS — I10 Essential (primary) hypertension: Secondary | ICD-10-CM

## 2017-08-15 DIAGNOSIS — M791 Myalgia, unspecified site: Secondary | ICD-10-CM | POA: Diagnosis not present

## 2017-08-15 DIAGNOSIS — E039 Hypothyroidism, unspecified: Secondary | ICD-10-CM | POA: Diagnosis not present

## 2017-08-15 DIAGNOSIS — R5383 Other fatigue: Secondary | ICD-10-CM

## 2017-08-15 MED ORDER — HYDROCHLOROTHIAZIDE 12.5 MG PO CAPS: 12.5000 mg | ORAL_CAPSULE | Freq: Every day | ORAL | 1 refills | Status: DC

## 2017-08-15 NOTE — Progress Notes (Signed)
Patient: Erika SettleJennifer A Hart Female    DOB: Aug 15, 1975   42 y.o.   MRN: [id number] Visit Date: 08/15/2017  Today's Provider: Margaretann LovelessJennifer M Rutherford Alarie, PA-C   Chief Complaint  Patient presents with  . Hypothyroidism   Subjective:    HPI Patient here today requesting TSH checked. Patient is C/O fatigue, constipation, weight gain, and hair loss. Patient C/O soreness all over body.   Patient requesting refill on HCTZ. Patient reports good tolerance and compliance with medication.    Allergies  Allergen Reactions  . Sulfa Antibiotics Rash     Current Outpatient Medications:  .  Aspirin-Acetaminophen-Caffeine (EXCEDRIN MIGRAINE PO), Take by mouth., Disp: , Rfl:  .  diclofenac (VOLTAREN) 75 MG EC tablet, TAKE 1 TABLET (75 MG TOTAL) BY MOUTH 2 (TWO) TIMES DAILY WITH MEALS. (Patient taking differently: as needed. TAKE 1 TABLET (75 MG TOTAL) BY MOUTH 2 (TWO) TIMES DAILY WITH MEALS.), Disp: 60 tablet, Rfl: 6 .  hydrochlorothiazide (MICROZIDE) 12.5 MG capsule, TAKE 1 CAPSULE (12.5 MG TOTAL) BY MOUTH DAILY., Disp: 90 capsule, Rfl: 1 .  levothyroxine (SYNTHROID, LEVOTHROID) 125 MCG tablet, TAKE 1 TABLET (125 MCG TOTAL) BY MOUTH DAILY., Disp: 90 tablet, Rfl: 1  Review of Systems  Constitutional: Positive for activity change, fatigue and unexpected weight change.  Respiratory: Negative.   Cardiovascular: Negative.   Endocrine: Negative.   Musculoskeletal: Positive for myalgias.     Social History   Tobacco Use  . Smoking status: Never Smoker  . Smokeless tobacco: Never Used  Substance Use Topics  . Alcohol use: Yes    Comment: 1-2 a month   Objective:   BP 96/70 (BP Location: Left Arm, Patient Position: Sitting, Cuff Size: Large)   Pulse 84   Temp 98.4 F (36.9 C) (Oral)   Resp 16   Ht 5\' 2"  (1.575 m)   Wt 183 lb (83 kg)   SpO2 99%   BMI 33.47 kg/m  Vitals:   08/15/17 0859  BP: 96/70  Pulse: 84  Resp: 16  Temp: 98.4 F (36.9 C)  TempSrc: Oral  SpO2: 99%  Weight:  183 lb (83 kg)  Height: 5\' 2"  (1.575 m)     Physical Exam  Constitutional: She appears well-developed and well-nourished. No distress.  HENT:  Head: Normocephalic and atraumatic.  Right Ear: Hearing, tympanic membrane, external ear and ear canal normal.  Left Ear: Hearing, tympanic membrane, external ear and ear canal normal.  Nose: Mucosal edema present. Right sinus exhibits no maxillary sinus tenderness and no frontal sinus tenderness. Left sinus exhibits no maxillary sinus tenderness and no frontal sinus tenderness.  Mouth/Throat: Uvula is midline, oropharynx is clear and moist and mucous membranes are normal. No oropharyngeal exudate.  Eyes: Conjunctivae are normal. Pupils are equal, round, and reactive to light. Right eye exhibits no discharge. Left eye exhibits no discharge. No scleral icterus.  Neck: Normal range of motion. Neck supple. No tracheal deviation present. No thyromegaly present.  Cardiovascular: Normal rate, regular rhythm and normal heart sounds. Exam reveals no gallop and no friction rub.  No murmur heard. Pulmonary/Chest: Effort normal and breath sounds normal. No stridor. No respiratory distress. She has no wheezes. She has no rales.  Lymphadenopathy:    She has no cervical adenopathy.  Skin: Skin is warm and dry. She is not diaphoretic.  Vitals reviewed.     Assessment & Plan:     1. Hypothyroidism, unspecified type Had been stable but recently having more symptoms again.  Will recheck labs and f/u pending results. Continue levothyroxine for now.  - T4 AND TSH - CBC w/Diff/Platelet  2. Myalgia Patient does have family history of autoimmune disorders and is interested in being retested as she has had a positive and a negative ANA in the past, but was never worked up. Will check labs as below and f/u pending results. - T4 AND TSH - ANA,IFA RA Diag Pnl w/rflx Tit/Patn - CBC w/Diff/Platelet - Vitamin D (25 hydroxy)  3. Fatigue, unspecified type See  above medical treatment plan. - T4 AND TSH - ANA,IFA RA Diag Pnl w/rflx Tit/Patn - CBC w/Diff/Platelet - Vitamin D (25 hydroxy)  4. Avitaminosis D H/O this and on supplementation. Will check to make sure she is having increase in Vit D levels. - ANA,IFA RA Diag Pnl w/rflx Tit/Patn - CBC w/Diff/Platelet - Vitamin D (25 hydroxy)  5. Essential hypertension Stable. Diagnosis pulled for medication refill. Continue current medical treatment plan. - hydrochlorothiazide (MICROZIDE) 12.5 MG capsule; Take 1 capsule (12.5 mg total) by mouth daily.  Dispense: 90 capsule; Refill: 1       Margaretann Loveless, PA-C  [facility]

## 2017-08-15 NOTE — Patient Instructions (Signed)
Hypothyroidism Hypothyroidism is a disorder of the thyroid. The thyroid is a large gland that is located in the lower front of the neck. The thyroid releases hormones that control how the body works. With hypothyroidism, the thyroid does not make enough of these hormones. What are the causes? Causes of hypothyroidism may include:  Viral infections.  Pregnancy.  Your own defense system (immune system) attacking your thyroid.  Certain medicines.  Birth defects.  Past radiation treatments to your head or neck.  Past treatment with radioactive iodine.  Past surgical removal of part or all of your thyroid.  Problems with the gland that is located in the center of your brain (pituitary).  What are the signs or symptoms? Signs and symptoms of hypothyroidism may include:  Feeling as though you have no energy (lethargy).  Inability to tolerate cold.  Weight gain that is not explained by a change in diet or exercise habits.  Dry skin.  Coarse hair.  Menstrual irregularity.  Slowing of thought processes.  Constipation.  Sadness or depression.  How is this diagnosed? Your health care provider may diagnose hypothyroidism with blood tests and ultrasound tests. How is this treated? Hypothyroidism is treated with medicine that replaces the hormones that your body does not make. After you begin treatment, it may take several weeks for symptoms to go away. Follow these instructions at home:  Take medicines only as directed by your health care provider.  If you start taking any new medicines, tell your health care provider.  Keep all follow-up visits as directed by your health care provider. This is important. As your condition improves, your dosage needs may change. You will need to have blood tests regularly so that your health care provider can watch your condition. Contact a health care provider if:  Your symptoms do not get better with treatment.  You are taking thyroid  replacement medicine and: ? You sweat excessively. ? You have tremors. ? You feel anxious. ? You lose weight rapidly. ? You cannot tolerate heat. ? You have emotional swings. ? You have diarrhea. ? You feel weak. Get help right away if:  You develop chest pain.  You develop an irregular heartbeat.  You develop a rapid heartbeat. This information is not intended to replace advice given to you by your health care provider. Make sure you discuss any questions you have with your health care provider. Document Released: 07/15/2005 Document Revised: 12/21/2015 Document Reviewed: 11/30/2013 Elsevier Interactive Patient Education  2018 Elsevier Inc.  

## 2017-08-18 LAB — CBC WITH DIFFERENTIAL/PLATELET
BASOS ABS: 0 10*3/uL (ref 0.0–0.2)
BASOS: 1 %
EOS (ABSOLUTE): 0.1 10*3/uL (ref 0.0–0.4)
Eos: 2 %
Hematocrit: 44.7 % (ref 34.0–46.6)
Hemoglobin: 15.2 g/dL (ref 11.1–15.9)
IMMATURE GRANS (ABS): 0 10*3/uL (ref 0.0–0.1)
IMMATURE GRANULOCYTES: 0 %
LYMPHS: 23 %
Lymphocytes Absolute: 1.4 10*3/uL (ref 0.7–3.1)
MCH: 30.6 pg (ref 26.6–33.0)
MCHC: 34 g/dL (ref 31.5–35.7)
MCV: 90 fL (ref 79–97)
Monocytes Absolute: 0.4 10*3/uL (ref 0.1–0.9)
Monocytes: 6 %
NEUTROS PCT: 68 %
Neutrophils Absolute: 4.3 10*3/uL (ref 1.4–7.0)
PLATELETS: 313 10*3/uL (ref 150–379)
RBC: 4.96 x10E6/uL (ref 3.77–5.28)
RDW: 13.3 % (ref 12.3–15.4)
WBC: 6.2 10*3/uL (ref 3.4–10.8)

## 2017-08-18 LAB — ANA,IFA RA DIAG PNL W/RFLX TIT/PATN
ANA Titer 1: NEGATIVE
CYCLIC CITRULLIN PEPTIDE AB: 9 U (ref 0–19)

## 2017-08-18 LAB — VITAMIN D 25 HYDROXY (VIT D DEFICIENCY, FRACTURES): VIT D 25 HYDROXY: 30.7 ng/mL (ref 30.0–100.0)

## 2017-08-18 LAB — T4 AND TSH
T4, Total: 9.9 ug/dL (ref 4.5–12.0)
TSH: 0.35 u[IU]/mL — ABNORMAL LOW (ref 0.450–4.500)

## 2017-08-19 ENCOUNTER — Telehealth: Payer: Self-pay

## 2017-08-19 ENCOUNTER — Emergency Department: Payer: Commercial Managed Care - HMO

## 2017-08-19 ENCOUNTER — Inpatient Hospital Stay
Admission: AD | Admit: 2017-08-19 | Payer: Self-pay | Source: Other Acute Inpatient Hospital | Admitting: Orthopedic Surgery

## 2017-08-19 ENCOUNTER — Emergency Department
Admission: EM | Admit: 2017-08-19 | Discharge: 2017-08-20 | Disposition: A | Discharge status: Other Acute Inpatient Hospital | Payer: Commercial Managed Care - HMO | Attending: Emergency Medicine | Admitting: Emergency Medicine

## 2017-08-19 ENCOUNTER — Telehealth: Payer: Self-pay | Admitting: Physician Assistant

## 2017-08-19 ENCOUNTER — Encounter: Payer: Self-pay | Admitting: Emergency Medicine

## 2017-08-19 ENCOUNTER — Other Ambulatory Visit: Payer: Self-pay

## 2017-08-19 DIAGNOSIS — S82831B Other fracture of upper and lower end of right fibula, initial encounter for open fracture type I or II: Secondary | ICD-10-CM | POA: Insufficient documentation

## 2017-08-19 DIAGNOSIS — E039 Hypothyroidism, unspecified: Secondary | ICD-10-CM | POA: Insufficient documentation

## 2017-08-19 DIAGNOSIS — Y998 Other external cause status: Secondary | ICD-10-CM | POA: Insufficient documentation

## 2017-08-19 DIAGNOSIS — S82401A Unspecified fracture of shaft of right fibula, initial encounter for closed fracture: Secondary | ICD-10-CM | POA: Diagnosis not present

## 2017-08-19 DIAGNOSIS — S82201B Unspecified fracture of shaft of right tibia, initial encounter for open fracture type I or II: Secondary | ICD-10-CM

## 2017-08-19 DIAGNOSIS — Y9389 Activity, other specified: Secondary | ICD-10-CM | POA: Diagnosis not present

## 2017-08-19 DIAGNOSIS — S8291XA Unspecified fracture of right lower leg, initial encounter for closed fracture: Secondary | ICD-10-CM | POA: Diagnosis not present

## 2017-08-19 DIAGNOSIS — S82391B Other fracture of lower end of right tibia, initial encounter for open fracture type I or II: Secondary | ICD-10-CM | POA: Insufficient documentation

## 2017-08-19 DIAGNOSIS — Z23 Encounter for immunization: Secondary | ICD-10-CM | POA: Insufficient documentation

## 2017-08-19 DIAGNOSIS — Z79899 Other long term (current) drug therapy: Secondary | ICD-10-CM | POA: Diagnosis not present

## 2017-08-19 DIAGNOSIS — S82401B Unspecified fracture of shaft of right fibula, initial encounter for open fracture type I or II: Secondary | ICD-10-CM

## 2017-08-19 DIAGNOSIS — I10 Essential (primary) hypertension: Secondary | ICD-10-CM | POA: Insufficient documentation

## 2017-08-19 DIAGNOSIS — S99921A Unspecified injury of right foot, initial encounter: Secondary | ICD-10-CM | POA: Diagnosis present

## 2017-08-19 DIAGNOSIS — S82201A Unspecified fracture of shaft of right tibia, initial encounter for closed fracture: Secondary | ICD-10-CM | POA: Diagnosis not present

## 2017-08-19 DIAGNOSIS — Y929 Unspecified place or not applicable: Secondary | ICD-10-CM | POA: Diagnosis not present

## 2017-08-19 DIAGNOSIS — W000XXA Fall on same level due to ice and snow, initial encounter: Secondary | ICD-10-CM | POA: Diagnosis not present

## 2017-08-19 DIAGNOSIS — S8261XA Displaced fracture of lateral malleolus of right fibula, initial encounter for closed fracture: Secondary | ICD-10-CM | POA: Diagnosis not present

## 2017-08-19 MED ORDER — TETANUS-DIPHTH-ACELL PERTUSSIS 5-2.5-18.5 LF-MCG/0.5 IM SUSP
0.5000 mL | Freq: Once | INTRAMUSCULAR | Status: AC
  Administered 2017-08-19: 0.5 mL via INTRAMUSCULAR
  Filled 2017-08-19: qty 0.5

## 2017-08-19 MED ORDER — FENTANYL CITRATE (PF) 100 MCG/2ML IJ SOLN
INTRAMUSCULAR | Status: AC
  Administered 2017-08-19: 100 ug via INTRAVENOUS
  Filled 2017-08-19: qty 2

## 2017-08-19 MED ORDER — SODIUM CHLORIDE 0.9 % IV SOLN
INTRAVENOUS | Status: AC | PRN
  Administered 2017-08-19: 1000 mL via INTRAVENOUS

## 2017-08-19 MED ORDER — FENTANYL CITRATE (PF) 100 MCG/2ML IJ SOLN
INTRAMUSCULAR | Status: AC
  Filled 2017-08-19: qty 4

## 2017-08-19 MED ORDER — FENTANYL CITRATE (PF) 100 MCG/2ML IJ SOLN
100.0000 ug | Freq: Once | INTRAMUSCULAR | Status: AC
  Administered 2017-08-19: 100 ug via INTRAVENOUS

## 2017-08-19 MED ORDER — FLUMAZENIL 0.5 MG/5ML IV SOLN
INTRAVENOUS | Status: AC
  Filled 2017-08-19: qty 5

## 2017-08-19 MED ORDER — HYDROMORPHONE HCL 1 MG/ML IJ SOLN
1.0000 mg | Freq: Once | INTRAMUSCULAR | Status: AC
  Administered 2017-08-19: 1 mg via INTRAVENOUS

## 2017-08-19 MED ORDER — ONDANSETRON HCL 4 MG/2ML IJ SOLN
INTRAMUSCULAR | Status: AC
  Filled 2017-08-19: qty 2

## 2017-08-19 MED ORDER — ONDANSETRON HCL 4 MG/2ML IJ SOLN
4.0000 mg | Freq: Once | INTRAMUSCULAR | Status: AC
  Administered 2017-08-19: 4 mg via INTRAVENOUS

## 2017-08-19 MED ORDER — MIDAZOLAM HCL 5 MG/5ML IJ SOLN
INTRAMUSCULAR | Status: AC
  Administered 2017-08-19: 2 mg via INTRAVENOUS
  Filled 2017-08-19: qty 10

## 2017-08-19 MED ORDER — HYDROMORPHONE HCL 1 MG/ML IJ SOLN
1.0000 mg | INTRAMUSCULAR | Status: AC
  Administered 2017-08-19: 1 mg via INTRAVENOUS
  Filled 2017-08-19: qty 1

## 2017-08-19 MED ORDER — HYDROMORPHONE HCL 1 MG/ML IJ SOLN
INTRAMUSCULAR | Status: DC
Start: 2017-08-19 — End: 2017-08-20
  Filled 2017-08-19: qty 1

## 2017-08-19 MED ORDER — DIAZEPAM 5 MG/ML IJ SOLN: INTRAMUSCULAR | Status: AC | PRN

## 2017-08-19 MED ORDER — DEXTROSE 5 % IV SOLN
1.0000 g | Freq: Once | INTRAVENOUS | Status: AC
  Administered 2017-08-19: 1 g via INTRAVENOUS
  Filled 2017-08-19: qty 10

## 2017-08-19 MED ORDER — FENTANYL CITRATE (PF) 100 MCG/2ML IJ SOLN
INTRAMUSCULAR | Status: AC | PRN
  Administered 2017-08-19 (×2): 50 ug via INTRAVENOUS

## 2017-08-19 MED ORDER — LEVOTHYROXINE SODIUM 112 MCG PO TABS: 125.0000 ug | ORAL_TABLET | Freq: Every day | ORAL | 3 refills | Status: DC

## 2017-08-19 MED ORDER — NALOXONE HCL 2 MG/2ML IJ SOSY
PREFILLED_SYRINGE | INTRAMUSCULAR | Status: AC
  Filled 2017-08-19: qty 2

## 2017-08-19 MED ORDER — MIDAZOLAM HCL 5 MG/5ML IJ SOLN
INTRAMUSCULAR | Status: AC | PRN
  Administered 2017-08-19 (×2): 2 mg via INTRAVENOUS

## 2017-08-19 NOTE — Telephone Encounter (Signed)
Joni ReiningNicole with [facility] in [place] stated the Rx they received for levothyroxine (SYNTHROID, LEVOTHROID) 112 MCG tablet was for the [employer] manufacture and they don't have that in stock. Joni Reiningicole is requesting a call back for verbal permission to change it to [place] which they have in stock. Please advise. Thanks TNP

## 2017-08-19 NOTE — Sedation Documentation (Signed)
X-ray at bedside

## 2017-08-19 NOTE — Telephone Encounter (Signed)
-----   Message from Margaretann LovelessJennifer M Burnette, [place]PA-C sent at 08/19/2017 10:17 AM EST ----- Thyroid is actually slightly overcorrected. Would decrease levothyroxine to 112 mcg if agreeable. Autoimmune and rheumatoid factor labs are negative. Vit D is low normal at 30.7. Continue OTC Vit D supplementation of 1000-2000 IU daily. Blood count is normal.

## 2017-08-19 NOTE — ED Provider Notes (Signed)
[facility] Emergency Department Provider Note   ____________________________________________   First MD Initiated Contact with Patient 08/19/17 1859     (approximate)  I have reviewed the triage vital signs and the nursing notes.   HISTORY  Chief Complaint Ankle Pain    HPI Erika Hart is a 42 y.o. female Patient slipped on the ice and has an obvious deformity of her ankle she has no other injuries she says.pain in the ankle severe. She has no numbness distally.  Past Medical History:  Diagnosis Date  . Allergy   . Arthritis   . GERD (gastroesophageal reflux disease)   . Hypertension   . Thyroid disease     Patient Active Problem List   Diagnosis Date Noted  . Bulge of lumbar disc without myelopathy 10/20/2015  . BP (high blood pressure) 10/20/2015  . Acquired hypothyroidism 10/20/2015  . Headache, migraine 10/20/2015  . Adult BMI 30+ 10/20/2015  . Esophageal reflux 10/20/2015    Past Surgical History:  Procedure Laterality Date  . CESAREAN SECTION  2004    Prior to Admission medications   Medication Sig Start Date End Date Taking? Authorizing Provider  Aspirin-Acetaminophen-Caffeine (EXCEDRIN MIGRAINE PO) Take by mouth.    [provider]  diclofenac (VOLTAREN) 75 MG EC tablet TAKE 1 TABLET (75 MG TOTAL) BY MOUTH 2 (TWO) TIMES DAILY WITH MEALS. Patient taking differently: as needed. TAKE 1 TABLET (75 MG TOTAL) BY MOUTH 2 (TWO) TIMES DAILY WITH MEALS. 12/27/15   Margaretann Loveless, PA-C  hydrochlorothiazide (MICROZIDE) 12.5 MG capsule Take 1 capsule (12.5 mg total) by mouth daily. 08/15/17   Margaretann Loveless, PA-C  levothyroxine (SYNTHROID, LEVOTHROID) 112 MCG tablet Take 1 tablet (112 mcg total) by mouth daily. 08/19/17   Margaretann Loveless, PA-C    Allergies Sulfa antibiotics  Family History  Problem Relation Age of Onset  . Thyroid disease Mother   . Diabetes Father   . Stroke Father   . Healthy Daughter    . Breast cancer Neg Hx     Social History Social History   Tobacco Use  . Smoking status: Never Smoker  . Smokeless tobacco: Never Used  Substance Use Topics  . Alcohol use: Yes    Comment: 1-2 a month  . Drug use: No    Review of Systems  Constitutional: No fever/chills Eyes: No visual changes. ENT: No sore throat. Cardiovascular: Denies chest pain. Respiratory: Denies shortness of breath. Gastrointestinal: No abdominal pain.  No nausea, no vomiting.  No diarrhea.  No constipation. Genitourinary: Negative for dysuria. Musculoskeletal: Negative for back pain. Skin: Negative for rash. Neurological: Negative for headaches, focal weakness  ____________________________________________   PHYSICAL EXAM:  VITAL SIGNS: ED Triage Vitals  Enc Vitals Group     BP 08/19/17 1919 121/84     Pulse Rate 08/19/17 1919 82     Resp 08/19/17 1919 (!) 22     Temp 08/19/17 1919 97.9 F (36.6 C)     Temp Source 08/19/17 1919 Oral     SpO2 08/19/17 1919 100 %     Weight --      Height --      Head Circumference --      Peak Flow --      Pain Score 08/19/17 1851 10     Pain Loc --      Pain Edu? --      Excl. in GC? --     Constitutional: Alert and oriented. Well  appearing and in no acute distress. Eyes: Conjunctivae are normal.  Head: Atraumatic. Nose: No congestion/rhinnorhea. Mouth/Throat: Mucous membranes are moist.  Oropharynx non-erythematous. Neck: No stridor. Cardiovascular: Normal rate, regular rhythm. Grossly normal heart sounds.  Good peripheral circulation. Respiratory: Normal respiratory effort.  No retractions. Lungs CTAB. Gastrointestinal: Soft and nontender. No distention. No abdominal bruits. No CVA tenderness. Musculoskeletal: patient has deformity of the ankle there are several puncture wounds and skin which are oozing small amounts of blood. Neurologic:  Normal speech and language. No gross focal neurologic deficits are appreciated.  Skin:  Skin is warm,  dry and intact. No rash noted. Psychiatric: Mood and affect are normal. Speech and behavior are normal.  ____________________________________________   LABS (all labs ordered are listed, but only abnormal results are displayed)  Labs Reviewed - No data to display ____________________________________________  EKG   ____________________________________________  RADIOLOGY  patient has a fracture of both the tibia and fibula distally.____________________________________________   PROCEDURES  Procedure(s) performed: consent obtained for conscious sedation after discussing it with the patient and her husband. Patient was sedated with fentanyl and Versed ankle splint was placed by Dr. Martha ClanKrasinski with my assistance patient woke up easily did not desat Dr. Martha ClanKrasinski [facility] however patient is likely to spend quite some time here because beds are currently not available there  Procedures  Critical Care performed:   ____________________________________________   INITIAL IMPRESSION / ASSESSMENT AND PLAN / ED COURSE  I have paged Dr. Martha ClanKrasinski will give her a gram of Ancef IV. We will get the pain under little bit better control trying splint this for comfort        ____________________________________________   FINAL CLINICAL IMPRESSION(S) / ED DIAGNOSES  Final diagnoses:  Type I or II open fracture of right tibia and fibula, initial encounter  diagnosis is open fracture of the right tibia and fibula   ED Discharge Orders    None       Note:  This document was prepared using Dragon voice recognition software and may include unintentional dictation errors.    Arnaldo NatalMalinda, Paul F, MD 08/19/17 2220

## 2017-08-19 NOTE — ED Triage Notes (Signed)
Pt fell on the ice and possible right ankle injury.  Obvious deformity noted.

## 2017-08-19 NOTE — Telephone Encounter (Signed)
Patient advised as below. Patient verbalizes understanding and is in agreement with treatment plan. Recheck tsh in 4-6 weeks.

## 2017-08-19 NOTE — Sedation Documentation (Signed)
Dr. Darnelle CatalanMalinda, Dr. Martha ClanKrasinski, Judeth CornfieldStephanie EDT, Tresa EndoKelly RN, and this writer at bedside.

## 2017-08-19 NOTE — ED Notes (Signed)
Nyoka LintKrasinki, MD paged

## 2017-08-19 NOTE — ED Notes (Signed)
Krasinki, MD paged 

## 2017-08-19 NOTE — Consult Note (Signed)
ORTHOPAEDIC CONSULTATION  REQUESTING PHYSICIAN: Arnaldo NatalMalinda, Paul F, MD  Chief Complaint: Right open distal tibia and fibula fractures  HPI: Erika SettleJennifer A Hart is a 42 y.o. female who slipped on ice in her driveway this evening. Patient had immediate pain and deformity.  She was unable to bear weight on the right lower extremity following the injury. Patient was diagnosed with a distal tibia and fibula fracture by x-ray and the [facility] emergency [facility]. Patient was noted to have bleeding over the medial leg. Patient states she has sensation in her right foot. She is seen in the ER with her husband at the bedside. Patient complains of significant pain in the right leg.  Past Medical History:  Diagnosis Date  . Allergy   . Arthritis   . GERD (gastroesophageal reflux disease)   . Hypertension   . Thyroid disease    Past Surgical History:  Procedure Laterality Date  . CESAREAN SECTION  2004   Social History   Socioeconomic History  . Marital status: Married    Spouse name: None  . Number of children: None  . Years of education: None  . Highest education level: None  Social Needs  . Financial resource strain: None  . Food insecurity - worry: None  . Food insecurity - inability: None  . Transportation needs - medical: None  . Transportation needs - non-medical: None  Occupational History  . None  Tobacco Use  . Smoking status: Never Smoker  . Smokeless tobacco: Never Used  Substance and Sexual Activity  . Alcohol use: Yes    Comment: 1-2 a month  . Drug use: No  . Sexual activity: None  Other Topics Concern  . None  Social History Narrative  . None   Family History  Problem Relation Age of Onset  . Thyroid disease Mother   . Diabetes Father   . Stroke Father   . Healthy Daughter   . Breast cancer Neg Hx    Allergies  Allergen Reactions  . Sulfa Antibiotics Rash   Prior to Admission medications   Medication Sig Start Date End Date Taking?  Authorizing Provider  Aspirin-Acetaminophen-Caffeine (EXCEDRIN MIGRAINE PO) Take by mouth.    [provider]  diclofenac (VOLTAREN) 75 MG EC tablet TAKE 1 TABLET (75 MG TOTAL) BY MOUTH 2 (TWO) TIMES DAILY WITH MEALS. Patient taking differently: as needed. TAKE 1 TABLET (75 MG TOTAL) BY MOUTH 2 (TWO) TIMES DAILY WITH MEALS. 12/27/15   Margaretann LovelessBurnette, Coumba M, PA-C  hydrochlorothiazide (MICROZIDE) 12.5 MG capsule Take 1 capsule (12.5 mg total) by mouth daily. 08/15/17   Margaretann LovelessBurnette, Davinity M, PA-C  levothyroxine (SYNTHROID, LEVOTHROID) 112 MCG tablet Take 1 tablet (112 mcg total) by mouth daily. 08/19/17   Margaretann LovelessBurnette, Ramonica M, PA-C   Dg Ankle Complete Right  Result Date: 08/19/2017 CLINICAL DATA:  Fall on ice today with ankle injury. EXAM: RIGHT ANKLE - COMPLETE 3+ VIEW COMPARISON:  None. FINDINGS: Initial encounter. Distal fibular diaphysis and tibial metaphysis fractures with 100% displacement at the fibula and 50% displacement at the tibia, foot lateral. The fracture is open with soft tissue gas about the tibia. Located ankle and hindfoot. IMPRESSION: Open, displaced fractures of the distal tibia and fibula. Electronically Signed   By: Marnee SpringJonathon  Watts M.D.   On: 08/19/2017 19:37   Ct Ankle Right Wo Contrast  Result Date: 08/19/2017 CLINICAL DATA:  Right ankle fractures EXAM: CT OF THE RIGHT ANKLE WITHOUT CONTRAST TECHNIQUE: Multidetector CT imaging of the right ankle was performed  according to the standard protocol. Multiplanar CT image reconstructions were also generated. COMPARISON:  Radiographs from earlier on the same day FINDINGS: Bones/Joint/Cartilage Acute, open fractures of the distal tibial metadiaphysis and distal fibular diaphysis with lateral displacement of the distal fracture fragments by 1 shaft width involving the fibula and up to 9 mm involving the distal tibia. Comminuted fracture involving the medial malleolus is noted with sagittal as well as coronal oblique fracture components  extending into the ankle joint sagittal fracture undermining the medial malleolus is noted with fracture involving the posteromedial corner of the tibial plafond. Surrounding soft tissue emphysema is seen about the ankle fractures compatible with an open fracture. Calcaneal enthesopathy is noted along the plantar dorsal aspect. Intact subtalar joint. Intact talus, calcaneus, navicular, cuboid and cuneiforms. Ligaments Suboptimally assessed by CT. Muscles and Tendons No tendon entrapment of the anterior, posteromedial nor lateral tendons crossing the ankle joint. No intramuscular hemorrhage or hematoma. Soft tissues Soft tissue emphysema about the fractures compatible with open fractures. IMPRESSION: 1. Acute open fractures of the distal tibia and fibula with lateral displacement as above described. 2. Comminuted intra-articular fractures of the medial malleolus with posteromedial corner fracture of the tibial plafond. 3. No widening of the medial clear space of the ankle joint. Electronically Signed   By: Tollie Eth M.D.   On: 08/19/2017 20:30    Positive ROS: All other systems have been reviewed and were otherwise negative with the exception of those mentioned in the HPI and as above.  Physical Exam: General: Alert, no acute distress, but appears uncomfortable  MUSCULOSKELETAL: Right lower extremity: Patient has an external rotation deformity of her right foot in relation to the right leg. Her compartments are soft and compressible. Patient has 2 small inside out vertical, linear puncture wounds approximately 0.5 cm in length.  These are located approximately 4 cm above the medial malleolus. There is no gross contamination of the wounds. There is no visible bone. Patient has palpable pedal pulses. She has intact sensation to light touch throughout her right foot. She can gently flex and extend her toes although range of motion is limited due to pain. Patient's foot and toes appear well  perfused.  Assessment: Open distal tibia and fibula fracture, right leg status post fall  Plan: I have explained to the patient and her husband the nature of her injury. I reviewed the x-ray films personally. I have discussed this case with Dr. Myrene Galas, the orthopedic trauma specialist at [facility]. Given the complex nature of this fracture I felt this patient warranted transfer to a facility with an orthopaedic trauma specialist.  Dr. Carola Frost reviewed the patient's x-rays and has agreed to accept the patient in transfer.   Patient has received IV Ancef in the ER. She is also ordered for a tetanus shot. Patient has completed a CT scan of her right lower leg that I ordered for surgical planning.  Patient was given conscious sedation by the ER physician, Dr. Dorothea Glassman.  This allowed for her leg to be cleaned with alcohol swabs of all dried blood. The size of the puncture wound was confirmed as noted above. Sterile gauze was applied over the wounds. Patient then was placed in an AO splint to stabilize the leg for transfer.  Patient had intact sensation in her toes and her toes were well perfused with excellent capillary refill following splinting of the right leg.    Dr. Darnelle Catalan will call for transfer of Ms. Coull to [facility]  tonight on behalf of Dr. Myrene Galas, the accepting orthopaedic surgeon.  She will need to be NPO after midnight for surgery in the AM.   Dr. Carola Frost should be contacted upon the patient's arrival to the [facility] ER.    Juanell Fairly, MD    08/19/2017 9:08 PM

## 2017-08-19 NOTE — ED Notes (Signed)
F/u call with [facility], pt on list for bed. Maybe be here until 430

## 2017-08-20 ENCOUNTER — Ambulatory Visit (HOSPITAL_COMMUNITY): Payer: 59

## 2017-08-20 ENCOUNTER — Encounter (HOSPITAL_COMMUNITY): Payer: Self-pay | Admitting: Certified Registered Nurse Anesthetist

## 2017-08-20 ENCOUNTER — Ambulatory Visit (HOSPITAL_COMMUNITY): Payer: 59 | Admitting: Certified Registered Nurse Anesthetist

## 2017-08-20 ENCOUNTER — Other Ambulatory Visit: Payer: Self-pay

## 2017-08-20 ENCOUNTER — Inpatient Hospital Stay (HOSPITAL_COMMUNITY): Payer: 59

## 2017-08-20 ENCOUNTER — Inpatient Hospital Stay (HOSPITAL_COMMUNITY)
Admission: AD | Admit: 2017-08-20 | Discharge: 2017-08-22 | DRG: 492 | Disposition: A | Discharge status: Home / Self Care | Payer: 59 | Source: Ambulatory Visit | Attending: Student | Admitting: Student

## 2017-08-20 ENCOUNTER — Encounter (HOSPITAL_COMMUNITY)
Admission: AD | Disposition: A | Discharge status: Home / Self Care | Payer: Self-pay | Source: Ambulatory Visit | Attending: Student

## 2017-08-20 DIAGNOSIS — S82209B Unspecified fracture of shaft of unspecified tibia, initial encounter for open fracture type I or II: Secondary | ICD-10-CM | POA: Diagnosis present

## 2017-08-20 DIAGNOSIS — E039 Hypothyroidism, unspecified: Secondary | ICD-10-CM | POA: Diagnosis present

## 2017-08-20 DIAGNOSIS — Z419 Encounter for procedure for purposes other than remedying health state, unspecified: Secondary | ICD-10-CM

## 2017-08-20 DIAGNOSIS — Z7982 Long term (current) use of aspirin: Secondary | ICD-10-CM

## 2017-08-20 DIAGNOSIS — M79661 Pain in right lower leg: Secondary | ICD-10-CM | POA: Diagnosis present

## 2017-08-20 DIAGNOSIS — Z882 Allergy status to sulfonamides status: Secondary | ICD-10-CM

## 2017-08-20 DIAGNOSIS — K219 Gastro-esophageal reflux disease without esophagitis: Secondary | ICD-10-CM | POA: Diagnosis not present

## 2017-08-20 DIAGNOSIS — I10 Essential (primary) hypertension: Secondary | ICD-10-CM | POA: Diagnosis present

## 2017-08-20 DIAGNOSIS — S82871B Displaced pilon fracture of right tibia, initial encounter for open fracture type I or II: Secondary | ICD-10-CM | POA: Diagnosis present

## 2017-08-20 DIAGNOSIS — T148XXA Other injury of unspecified body region, initial encounter: Secondary | ICD-10-CM

## 2017-08-20 DIAGNOSIS — W000XXA Fall on same level due to ice and snow, initial encounter: Secondary | ICD-10-CM | POA: Diagnosis present

## 2017-08-20 DIAGNOSIS — D62 Acute posthemorrhagic anemia: Secondary | ICD-10-CM | POA: Diagnosis not present

## 2017-08-20 DIAGNOSIS — Z79899 Other long term (current) drug therapy: Secondary | ICD-10-CM | POA: Diagnosis not present

## 2017-08-20 DIAGNOSIS — S82391B Other fracture of lower end of right tibia, initial encounter for open fracture type I or II: Secondary | ICD-10-CM | POA: Diagnosis not present

## 2017-08-20 DIAGNOSIS — S82001A Unspecified fracture of right patella, initial encounter for closed fracture: Secondary | ICD-10-CM | POA: Diagnosis not present

## 2017-08-20 DIAGNOSIS — S8291XA Unspecified fracture of right lower leg, initial encounter for closed fracture: Secondary | ICD-10-CM | POA: Diagnosis not present

## 2017-08-20 DIAGNOSIS — Y92008 Other place in unspecified non-institutional (private) residence as the place of occurrence of the external cause: Secondary | ICD-10-CM

## 2017-08-20 HISTORY — PX: I & D EXTREMITY: SHX5045

## 2017-08-20 HISTORY — PX: OPEN REDUCTION INTERNAL FIXATION (ORIF) TIBIA/FIBULA FRACTURE: SHX5992

## 2017-08-20 HISTORY — PX: APPLICATION OF WOUND VAC: SHX5189

## 2017-08-20 LAB — CBC WITH DIFFERENTIAL/PLATELET
BASOS ABS: 0 10*3/uL (ref 0–0.1)
Basophils Relative: 0 %
EOS PCT: 1 %
Eosinophils Absolute: 0.1 10*3/uL (ref 0–0.7)
HCT: 39.1 % (ref 35.0–47.0)
Hemoglobin: 13.5 g/dL (ref 12.0–16.0)
LYMPHS ABS: 1 10*3/uL (ref 1.0–3.6)
Lymphocytes Relative: 13 %
MCH: 30.9 pg (ref 26.0–34.0)
MCHC: 34.4 g/dL (ref 32.0–36.0)
MCV: 89.6 fL (ref 80.0–100.0)
MONO ABS: 0.8 10*3/uL (ref 0.2–0.9)
Monocytes Relative: 11 %
Neutro Abs: 5.8 10*3/uL (ref 1.4–6.5)
Neutrophils Relative %: 75 %
Platelets: 249 10*3/uL (ref 150–440)
RBC: 4.37 MIL/uL (ref 3.80–5.20)
RDW: 12.4 % (ref 11.5–14.5)
WBC: 7.8 10*3/uL (ref 3.6–11.0)

## 2017-08-20 LAB — CBC
HEMATOCRIT: 33.5 % — AB (ref 36.0–46.0)
HEMOGLOBIN: 11.6 g/dL — AB (ref 12.0–15.0)
MCH: 30.9 pg (ref 26.0–34.0)
MCHC: 34.6 g/dL (ref 30.0–36.0)
MCV: 89.3 fL (ref 78.0–100.0)
Platelets: 271 10*3/uL (ref 150–400)
RBC: 3.75 MIL/uL — ABNORMAL LOW (ref 3.87–5.11)
RDW: 12.5 % (ref 11.5–15.5)
WBC: 9.2 10*3/uL (ref 4.0–10.5)

## 2017-08-20 LAB — BASIC METABOLIC PANEL
Anion gap: 7 (ref 5–15)
BUN: 12 mg/dL (ref 6–20)
CALCIUM: 8.6 mg/dL — AB (ref 8.9–10.3)
CO2: 27 mmol/L (ref 22–32)
Chloride: 102 mmol/L (ref 101–111)
Creatinine, Ser: 0.79 mg/dL (ref 0.44–1.00)
GFR calc Af Amer: >60 mL/min (ref >60)
GFR calc non Af Amer: >60 mL/min (ref >60)
Glucose, Bld: 112 mg/dL — ABNORMAL HIGH (ref 65–99)
POTASSIUM: 3.6 mmol/L (ref 3.5–5.1)
SODIUM: 136 mmol/L (ref 135–145)

## 2017-08-20 LAB — PROTIME-INR
INR: 1.01
PROTHROMBIN TIME: 13.2 s (ref 11.4–15.2)

## 2017-08-20 SURGERY — IRRIGATION AND DEBRIDEMENT EXTREMITY
Anesthesia: General | Site: Ankle | Laterality: Right

## 2017-08-20 MED ORDER — HYDROMORPHONE HCL 1 MG/ML IJ SOLN
1.0000 mg | Freq: Once | INTRAMUSCULAR | Status: AC
  Administered 2017-08-20: 1 mg via INTRAVENOUS

## 2017-08-20 MED ORDER — PROPOFOL 10 MG/ML IV BOLUS
INTRAVENOUS | Status: DC | PRN
  Administered 2017-08-20 (×2): 50 mg via INTRAVENOUS
  Administered 2017-08-20: 100 mg via INTRAVENOUS

## 2017-08-20 MED ORDER — CEFAZOLIN SODIUM 1 G IJ SOLR
INTRAMUSCULAR | Status: AC
  Filled 2017-08-20: qty 50

## 2017-08-20 MED ORDER — VANCOMYCIN HCL 1000 MG IV SOLR
INTRAVENOUS | Status: AC
  Filled 2017-08-20: qty 1000

## 2017-08-20 MED ORDER — VANCOMYCIN HCL 1000 MG IV SOLR
INTRAVENOUS | Status: DC | PRN
  Administered 2017-08-20: 1000 mg via TOPICAL

## 2017-08-20 MED ORDER — PROPOFOL 10 MG/ML IV BOLUS
INTRAVENOUS | Status: AC
  Filled 2017-08-20: qty 40

## 2017-08-20 MED ORDER — CEFAZOLIN SODIUM-DEXTROSE 1-4 GM/50ML-% IV SOLN
1.0000 g | Freq: Once | INTRAVENOUS | Status: DC
  Filled 2017-08-20: qty 50

## 2017-08-20 MED ORDER — OXYCODONE HCL 5 MG PO TABS
5.0000 mg | ORAL_TABLET | ORAL | Status: DC | PRN
  Administered 2017-08-20: 5 mg via ORAL

## 2017-08-20 MED ORDER — DEXTROSE 5 % IV SOLN
1000.0000 mg | Freq: Once | INTRAVENOUS | Status: AC
  Administered 2017-08-20: 1000 mg via INTRAVENOUS
  Filled 2017-08-20: qty 10

## 2017-08-20 MED ORDER — ENOXAPARIN SODIUM 40 MG/0.4ML ~~LOC~~ SOLN
40.0000 mg | SUBCUTANEOUS | Status: DC
  Administered 2017-08-20 – 2017-08-21 (×2): 40 mg via SUBCUTANEOUS
  Filled 2017-08-20 (×2): qty 0.4

## 2017-08-20 MED ORDER — POLYETHYLENE GLYCOL 3350 17 G PO PACK: 17.0000 g | PACK | Freq: Every day | ORAL | Status: DC | PRN

## 2017-08-20 MED ORDER — MORPHINE SULFATE (PF) 4 MG/ML IV SOLN
6.0000 mg | Freq: Once | INTRAVENOUS | Status: AC
  Administered 2017-08-20: 6 mg via INTRAVENOUS
  Filled 2017-08-20: qty 2

## 2017-08-20 MED ORDER — HYDROCHLOROTHIAZIDE 12.5 MG PO CAPS
12.5000 mg | ORAL_CAPSULE | Freq: Every day | ORAL | Status: DC
  Administered 2017-08-20 – 2017-08-22 (×3): 12.5 mg via ORAL
  Filled 2017-08-20 (×3): qty 1

## 2017-08-20 MED ORDER — MIDAZOLAM HCL 2 MG/2ML IJ SOLN
INTRAMUSCULAR | Status: AC
  Filled 2017-08-20: qty 2

## 2017-08-20 MED ORDER — NEOSTIGMINE METHYLSULFATE 5 MG/5ML IV SOSY
PREFILLED_SYRINGE | INTRAVENOUS | Status: AC
  Filled 2017-08-20: qty 5

## 2017-08-20 MED ORDER — METHOCARBAMOL 500 MG PO TABS
500.0000 mg | ORAL_TABLET | Freq: Four times a day (QID) | ORAL | Status: DC | PRN
  Administered 2017-08-20 – 2017-08-21 (×2): 500 mg via ORAL
  Filled 2017-08-20 (×2): qty 1

## 2017-08-20 MED ORDER — METHOCARBAMOL 500 MG PO TABS
ORAL_TABLET | ORAL | Status: AC
  Filled 2017-08-20: qty 1

## 2017-08-20 MED ORDER — OXYCODONE HCL 5 MG/5ML PO SOLN: 5.0000 mg | Freq: Once | ORAL | Status: AC | PRN

## 2017-08-20 MED ORDER — BACITRACIN ZINC 500 UNIT/GM EX OINT
TOPICAL_OINTMENT | CUTANEOUS | Status: AC
  Filled 2017-08-20: qty 28.35

## 2017-08-20 MED ORDER — HYDROMORPHONE HCL 1 MG/ML IJ SOLN
1.0000 mg | INTRAMUSCULAR | Status: AC
  Administered 2017-08-20: 1 mg via INTRAVENOUS
  Filled 2017-08-20: qty 1

## 2017-08-20 MED ORDER — OXYCODONE HCL 5 MG PO TABS
10.0000 mg | ORAL_TABLET | ORAL | Status: DC | PRN
  Administered 2017-08-21: 10 mg via ORAL
  Filled 2017-08-20 (×2): qty 2

## 2017-08-20 MED ORDER — HYDROMORPHONE HCL 1 MG/ML IJ SOLN
INTRAMUSCULAR | Status: AC
  Administered 2017-08-20: 0.5 mg via INTRAVENOUS
  Filled 2017-08-20: qty 1

## 2017-08-20 MED ORDER — CEFAZOLIN SODIUM-DEXTROSE 2-4 GM/100ML-% IV SOLN
2.0000 g | Freq: Three times a day (TID) | INTRAVENOUS | Status: AC
  Administered 2017-08-20 (×2): 2 g via INTRAVENOUS
  Filled 2017-08-20 (×4): qty 100

## 2017-08-20 MED ORDER — HYDROMORPHONE HCL 1 MG/ML IJ SOLN
1.0000 mg | INTRAMUSCULAR | Status: DC | PRN
  Administered 2017-08-20 – 2017-08-21 (×7): 1 mg via INTRAVENOUS
  Filled 2017-08-20 (×7): qty 1

## 2017-08-20 MED ORDER — ROCURONIUM BROMIDE 10 MG/ML (PF) SYRINGE
PREFILLED_SYRINGE | INTRAVENOUS | Status: DC | PRN
  Administered 2017-08-20: 50 mg via INTRAVENOUS

## 2017-08-20 MED ORDER — ONDANSETRON HCL 4 MG/2ML IJ SOLN
INTRAMUSCULAR | Status: AC
  Filled 2017-08-20: qty 2

## 2017-08-20 MED ORDER — SODIUM CHLORIDE 0.9 % IV SOLN
INTRAVENOUS | Status: AC
  Administered 2017-08-20: 14:00:00 via INTRAVENOUS

## 2017-08-20 MED ORDER — GLYCOPYRROLATE 0.2 MG/ML IJ SOLN
INTRAMUSCULAR | Status: DC | PRN
  Administered 2017-08-20: 0.4 mg via INTRAVENOUS

## 2017-08-20 MED ORDER — HYDROMORPHONE HCL 1 MG/ML IJ SOLN
0.2500 mg | INTRAMUSCULAR | Status: DC | PRN
  Administered 2017-08-20 (×4): 0.5 mg via INTRAVENOUS

## 2017-08-20 MED ORDER — HYDROMORPHONE HCL 1 MG/ML IJ SOLN
INTRAMUSCULAR | Status: AC
  Filled 2017-08-20: qty 1

## 2017-08-20 MED ORDER — ACETAMINOPHEN 325 MG PO TABS
650.0000 mg | ORAL_TABLET | Freq: Four times a day (QID) | ORAL | Status: DC | PRN
  Administered 2017-08-21: 650 mg via ORAL
  Filled 2017-08-20: qty 2

## 2017-08-20 MED ORDER — DEXAMETHASONE SODIUM PHOSPHATE 10 MG/ML IJ SOLN
INTRAMUSCULAR | Status: AC
  Filled 2017-08-20: qty 1

## 2017-08-20 MED ORDER — ONDANSETRON HCL 4 MG PO TABS: 4.0000 mg | ORAL_TABLET | Freq: Four times a day (QID) | ORAL | Status: DC | PRN

## 2017-08-20 MED ORDER — ONDANSETRON HCL 4 MG/2ML IJ SOLN
4.0000 mg | Freq: Four times a day (QID) | INTRAMUSCULAR | Status: DC | PRN
  Administered 2017-08-20: 4 mg via INTRAVENOUS
  Filled 2017-08-20 (×2): qty 2

## 2017-08-20 MED ORDER — SODIUM CHLORIDE 0.9 % IJ SOLN
INTRAMUSCULAR | Status: AC
  Filled 2017-08-20: qty 10

## 2017-08-20 MED ORDER — MIDAZOLAM HCL 5 MG/5ML IJ SOLN
INTRAMUSCULAR | Status: DC | PRN
  Administered 2017-08-20: 2 mg via INTRAVENOUS

## 2017-08-20 MED ORDER — METHOCARBAMOL 1000 MG/10ML IJ SOLN: 500.0000 mg | Freq: Four times a day (QID) | INTRAVENOUS | Status: DC | PRN

## 2017-08-20 MED ORDER — ONDANSETRON HCL 4 MG/2ML IJ SOLN
4.0000 mg | Freq: Four times a day (QID) | INTRAMUSCULAR | Status: AC | PRN
  Administered 2017-08-20: 4 mg via INTRAVENOUS

## 2017-08-20 MED ORDER — SODIUM CHLORIDE 0.9 % IV SOLN
Freq: Once | INTRAVENOUS | Status: AC
  Administered 2017-08-20: 01:00:00 via INTRAVENOUS

## 2017-08-20 MED ORDER — CEFAZOLIN SODIUM-DEXTROSE 2-3 GM-%(50ML) IV SOLR
INTRAVENOUS | Status: DC | PRN
  Administered 2017-08-20: 2 g via INTRAVENOUS

## 2017-08-20 MED ORDER — MORPHINE SULFATE (PF) 2 MG/ML IV SOLN: 2.0000 mg | INTRAVENOUS | Status: DC | PRN

## 2017-08-20 MED ORDER — ONDANSETRON HCL 4 MG/2ML IJ SOLN
INTRAMUSCULAR | Status: DC | PRN
  Administered 2017-08-20: 4 mg via INTRAVENOUS

## 2017-08-20 MED ORDER — ROCURONIUM BROMIDE 10 MG/ML (PF) SYRINGE
PREFILLED_SYRINGE | INTRAVENOUS | Status: AC
  Filled 2017-08-20: qty 5

## 2017-08-20 MED ORDER — DEXTROSE 5 % IV SOLN
1000.0000 mg | Freq: Once | INTRAVENOUS | Status: DC
  Filled 2017-08-20: qty 10

## 2017-08-20 MED ORDER — 0.9 % SODIUM CHLORIDE (POUR BTL) OPTIME
TOPICAL | Status: DC | PRN
  Administered 2017-08-20: 1000 mL

## 2017-08-20 MED ORDER — NEOSTIGMINE METHYLSULFATE 10 MG/10ML IV SOLN
INTRAVENOUS | Status: DC | PRN
  Administered 2017-08-20: 3 mg via INTRAVENOUS

## 2017-08-20 MED ORDER — DOCUSATE SODIUM 100 MG PO CAPS
100.0000 mg | ORAL_CAPSULE | Freq: Two times a day (BID) | ORAL | Status: DC
  Administered 2017-08-21 – 2017-08-22 (×3): 100 mg via ORAL
  Filled 2017-08-20 (×5): qty 1

## 2017-08-20 MED ORDER — LIDOCAINE 2% (20 MG/ML) 5 ML SYRINGE
INTRAMUSCULAR | Status: AC
  Filled 2017-08-20: qty 5

## 2017-08-20 MED ORDER — FENTANYL CITRATE (PF) 250 MCG/5ML IJ SOLN
INTRAMUSCULAR | Status: AC
  Filled 2017-08-20: qty 5

## 2017-08-20 MED ORDER — LEVOTHYROXINE SODIUM 125 MCG PO TABS
125.0000 ug | ORAL_TABLET | Freq: Every day | ORAL | Status: DC
  Administered 2017-08-21 – 2017-08-22 (×2): 125 ug via ORAL
  Filled 2017-08-20 (×3): qty 1

## 2017-08-20 MED ORDER — OXYCODONE HCL 5 MG PO TABS
5.0000 mg | ORAL_TABLET | Freq: Once | ORAL | Status: AC | PRN
  Administered 2017-08-20: 5 mg via ORAL

## 2017-08-20 MED ORDER — DIPHENHYDRAMINE HCL 12.5 MG/5ML PO ELIX: 12.5000 mg | ORAL_SOLUTION | ORAL | Status: DC | PRN

## 2017-08-20 MED ORDER — DEXAMETHASONE SODIUM PHOSPHATE 10 MG/ML IJ SOLN
INTRAMUSCULAR | Status: DC | PRN
  Administered 2017-08-20: 10 mg via INTRAVENOUS

## 2017-08-20 MED ORDER — LIDOCAINE 2% (20 MG/ML) 5 ML SYRINGE
INTRAMUSCULAR | Status: DC | PRN
  Administered 2017-08-20: 60 mg via INTRAVENOUS

## 2017-08-20 MED ORDER — OXYCODONE HCL 5 MG PO TABS
ORAL_TABLET | ORAL | Status: AC
  Administered 2017-08-20: 5 mg via ORAL
  Filled 2017-08-20: qty 1

## 2017-08-20 MED ORDER — OXYCODONE HCL 5 MG PO TABS
ORAL_TABLET | ORAL | Status: AC
  Filled 2017-08-20: qty 2

## 2017-08-20 MED ORDER — DEXTROSE 5 % IV SOLN
1000.0000 mg | Freq: Three times a day (TID) | INTRAVENOUS | Status: DC
  Filled 2017-08-20 (×3): qty 10

## 2017-08-20 MED ORDER — FENTANYL CITRATE (PF) 100 MCG/2ML IJ SOLN
INTRAMUSCULAR | Status: DC | PRN
  Administered 2017-08-20: 50 ug via INTRAVENOUS
  Administered 2017-08-20: 100 ug via INTRAVENOUS
  Administered 2017-08-20: 50 ug via INTRAVENOUS

## 2017-08-20 MED ORDER — SODIUM CHLORIDE 0.9 % IV SOLN
INTRAVENOUS | Status: DC | PRN
  Administered 2017-08-20 (×2): via INTRAVENOUS

## 2017-08-20 MED ORDER — ACETAMINOPHEN 650 MG RE SUPP: 650.0000 mg | Freq: Four times a day (QID) | RECTAL | Status: DC | PRN

## 2017-08-20 SURGICAL SUPPLY — 77 items
BANDAGE ACE 4X5 VEL STRL LF (GAUZE/BANDAGES/DRESSINGS) ×3 IMPLANT
BANDAGE ACE 6X5 VEL STRL LF (GAUZE/BANDAGES/DRESSINGS) ×3 IMPLANT
BIT DRILL 2.5X110 QC LCP DISP (BIT) ×3 IMPLANT
BIT DRILL LCP QC 2X140 (BIT) ×3 IMPLANT
BIT DRILL QC 3.5X110 (BIT) ×3 IMPLANT
BNDG COHESIVE 4X5 TAN STRL (GAUZE/BANDAGES/DRESSINGS) ×3 IMPLANT
BNDG GAUZE ELAST 4 BULKY (GAUZE/BANDAGES/DRESSINGS) ×6 IMPLANT
BRUSH SCRUB SURG 4.25 DISP (MISCELLANEOUS) ×6 IMPLANT
CHLORAPREP W/TINT 26ML (MISCELLANEOUS) ×3 IMPLANT
COVER MAYO STAND STRL (DRAPES) ×3 IMPLANT
COVER SURGICAL LIGHT HANDLE (MISCELLANEOUS) ×6 IMPLANT
DRAPE C-ARM 42X72 X-RAY (DRAPES) IMPLANT
DRAPE C-ARMOR (DRAPES) ×3 IMPLANT
DRAPE IMP U-DRAPE 54X76 (DRAPES) ×6 IMPLANT
DRAPE ORTHO SPLIT 77X108 STRL (DRAPES) ×2
DRAPE SURG 17X23 STRL (DRAPES) ×3 IMPLANT
DRAPE SURG ORHT 6 SPLT 77X108 (DRAPES) ×4 IMPLANT
DRAPE U-SHAPE 47X51 STRL (DRAPES) ×3 IMPLANT
DRESSING PEEL AND PLC PRVNA 13 (GAUZE/BANDAGES/DRESSINGS) ×2 IMPLANT
DRSG ADAPTIC 3X8 NADH LF (GAUZE/BANDAGES/DRESSINGS) ×3 IMPLANT
DRSG MEPITEL 4X7.2 (GAUZE/BANDAGES/DRESSINGS) IMPLANT
DRSG PAD ABDOMINAL 8X10 ST (GAUZE/BANDAGES/DRESSINGS) ×3 IMPLANT
DRSG PEEL AND PLACE PREVENA 13 (GAUZE/BANDAGES/DRESSINGS) ×3
ELECT REM PT RETURN 9FT ADLT (ELECTROSURGICAL) ×3
ELECTRODE REM PT RTRN 9FT ADLT (ELECTROSURGICAL) ×2 IMPLANT
EVACUATOR 1/8 PVC DRAIN (DRAIN) IMPLANT
GAUZE SPONGE 4X4 12PLY STRL (GAUZE/BANDAGES/DRESSINGS) ×3 IMPLANT
GLOVE BIO SURGEON STRL SZ7.5 (GLOVE) ×12 IMPLANT
GLOVE BIOGEL PI IND STRL 7.5 (GLOVE) ×2 IMPLANT
GLOVE BIOGEL PI INDICATOR 7.5 (GLOVE) ×1
GOWN STRL REUS W/ TWL LRG LVL3 (GOWN DISPOSABLE) ×4 IMPLANT
GOWN STRL REUS W/TWL LRG LVL3 (GOWN DISPOSABLE) ×2
HANDPIECE INTERPULSE COAX TIP (DISPOSABLE)
KIT BASIN OR (CUSTOM PROCEDURE TRAY) ×3 IMPLANT
KIT DRSG PREVENA PLUS 7DAY 125 (MISCELLANEOUS) ×3 IMPLANT
KIT ROOM TURNOVER OR (KITS) ×3 IMPLANT
MANIFOLD NEPTUNE II (INSTRUMENTS) ×3 IMPLANT
NEEDLE 22X1 1/2 (OR ONLY) (NEEDLE) IMPLANT
NS IRRIG 1000ML POUR BTL (IV SOLUTION) ×3 IMPLANT
PACK ORTHO EXTREMITY (CUSTOM PROCEDURE TRAY) ×3 IMPLANT
PAD ARMBOARD 7.5X6 YLW CONV (MISCELLANEOUS) ×6 IMPLANT
PADDING CAST COTTON 6X4 STRL (CAST SUPPLIES) ×6 IMPLANT
PADDING CAST SYN 6 (CAST SUPPLIES) ×1
PADDING CAST SYNTHETIC 6X4 NS (CAST SUPPLIES) ×2 IMPLANT
PLATE COMPRESSION 6H 2.7/3.5 (Plate) ×3 IMPLANT
PLATE LCP 3.5 1/3 TUB 6HX69 (Plate) ×3 IMPLANT
SCREW CORTEX 3.5 12MM (Screw) ×4 IMPLANT
SCREW CORTEX 3.5 14MM (Screw) ×2 IMPLANT
SCREW CORTEX 3.5 32MM (Screw) ×1 IMPLANT
SCREW CORTEX 3.5 36MM (Screw) ×1 IMPLANT
SCREW CORTEX 4.0 38MM (Screw) ×3 IMPLANT
SCREW CORTEX LOW PRO 3.5X24 (Screw) ×3 IMPLANT
SCREW CORTEX LOW PRO 3.5X26 (Screw) ×6 IMPLANT
SCREW HEADED 3.5X42 CORTICAL (Screw) ×3 IMPLANT
SCREW LOCK CORT ST 3.5X12 (Screw) ×8 IMPLANT
SCREW LOCK CORT ST 3.5X14 (Screw) ×4 IMPLANT
SCREW LOCK CORT ST 3.5X32 (Screw) ×2 IMPLANT
SCREW LOCK CORT ST 3.5X36 (Screw) ×2 IMPLANT
SCREW LOCK VA ST 2.7X14 (Screw) ×3 IMPLANT
SCREW LOCKING VA 2.7X40MM (Screw) ×6 IMPLANT
SCREW METAPHYSEAL 2.7X38 (Screw) ×3 IMPLANT
SET HNDPC FAN SPRY TIP SCT (DISPOSABLE) IMPLANT
SPONGE LAP 18X18 X RAY DECT (DISPOSABLE) ×3 IMPLANT
STAPLER VISISTAT 35W (STAPLE) ×3 IMPLANT
STRIP CLOSURE SKIN 1/2X4 (GAUZE/BANDAGES/DRESSINGS) IMPLANT
SUT ETHILON 2 0 FS 18 (SUTURE) ×6 IMPLANT
SUT ETHILON 3 0 PS 1 (SUTURE) ×6 IMPLANT
SUT MON AB 2-0 CT1 36 (SUTURE) ×3 IMPLANT
SUT PDS AB 0 CT 36 (SUTURE) IMPLANT
SUT PROLENE 0 CT (SUTURE) IMPLANT
SWAB CULTURE ESWAB REG 1ML (MISCELLANEOUS) IMPLANT
TOWEL OR 17X24 6PK STRL BLUE (TOWEL DISPOSABLE) ×6 IMPLANT
TOWEL OR 17X26 10 PK STRL BLUE (TOWEL DISPOSABLE) ×6 IMPLANT
TUBE CONNECTING 12X1/4 (SUCTIONS) ×3 IMPLANT
UNDERPAD 30X30 (UNDERPADS AND DIAPERS) ×3 IMPLANT
WATER STERILE IRR 1000ML POUR (IV SOLUTION) ×3 IMPLANT
YANKAUER SUCT BULB TIP NO VENT (SUCTIONS) ×3 IMPLANT

## 2017-08-20 NOTE — Telephone Encounter (Signed)
Pharmacist at [facility] advised

## 2017-08-20 NOTE — Anesthesia Procedure Notes (Signed)
Procedure Name: Intubation Date/Time: 08/20/2017 7:36 AM Performed by: Waynard EdwardsSmith, Irl Bodie A, CRNA Pre-anesthesia Checklist: Patient identified, Emergency Drugs available, Suction available and Patient being monitored Patient Re-evaluated:Patient Re-evaluated prior to induction Oxygen Delivery Method: Circle system utilized Preoxygenation: Pre-oxygenation with 100% oxygen Induction Type: IV induction Ventilation: Mask ventilation without difficulty Laryngoscope Size: Miller and 2 Grade View: Grade I Tube type: Oral Tube size: 7.0 mm Number of attempts: 1 Airway Equipment and Method: Stylet Placement Confirmation: ETT inserted through vocal cords under direct vision,  positive ETCO2 and breath sounds checked- equal and bilateral Secured at: 22 cm Tube secured with: Tape Dental Injury: Teeth and Oropharynx as per pre-operative assessment

## 2017-08-20 NOTE — ED Provider Notes (Signed)
Clinical Course as of Aug 20 454  Wed Aug 20, 2017  0102 Assumed care while patient is awaiting [facility].  Patient still having severe pain and has had 2 doses of Dilaudid 1 mg IV.  I will consider a fentanyl drip if her pain is still not under control.Additionally we reiterated n.p.o. status and I have started a normal saline infusion at 100 mL for maintenance fluid.  I ordered an additional dose of cefazolin 1 g IV for 8 hours after the original which would put the second dose for 3:45 AM if she is still in our emergency department.   [CF]  0154 When reviewing the patient's record I noticed that no blood work has been performed.  In order to facilitate surgical treatment, I have ordered basic lab work.  Held off on type and screen since it will need to be done at the receiving facility.  [CF]  0345 The patient is to be [facility] to [facility] hopefully within about an hour.  She is hurting again and Dilaudid and fentanyl have not been very effective, so I will try morphine 6 mg IV.  Since patient is being transported soon, will not start an infusion of fentanyl  [CF]  0346 labs unremarkable  [CF]  0346 Nurse to start second dose of cefazolin shortly  [CF]  0445 [facility] is on the way for transport.  I reassessed the patient and she is stable but still in pain.  We will give another dose of Dilaudid 1 mg IV prior to transport.  She has gotten her second dose of antibiotics.  She understands the plan of care.  [CF]    Clinical Course User Index [CF] Loleta RoseForbach, Millard Bautch, MD      Loleta RoseForbach, Anjolina Byrer, MD 08/20/17 [id number]

## 2017-08-20 NOTE — ED Notes (Signed)
Pt c/o burning sensation and pain in the leg, good cap refill, toes warm to touch. Elevated extremity, ice packs applied.

## 2017-08-20 NOTE — Transfer of Care (Signed)
Immediate Anesthesia Transfer of Care Note  Patient: Erika Hart  Procedure(s) Performed: IRRIGATION AND DEBRIDEMENT EXTREMITY (Right Ankle) OPEN REDUCTION INTERNAL FIXATION (ORIF) TIBIA/FIBULA FRACTURE (Right Ankle) APPLICATION OF WOUND VAC (Right Ankle)  Patient Location: PACU  Anesthesia Type:General  Level of Consciousness: drowsy  Airway & Oxygen Therapy: Patient Spontanous Breathing and Patient connected to nasal cannula oxygen  Post-op Assessment: Report given to RN and Post -op Vital signs reviewed and stable  Post vital signs: Reviewed and stable  Last Vitals: There were no vitals filed for this visit.  Last Pain: There were no vitals filed for this visit.       Complications: No apparent anesthesia complications

## 2017-08-20 NOTE — Anesthesia Preprocedure Evaluation (Signed)
Anesthesia Evaluation  Patient identified by MRN, date of birth, ID band Patient awake    Reviewed: Allergy & Precautions, H&P , NPO status , Patient's Chart, lab work & pertinent test results  Airway Mallampati: II   Neck ROM: full    Dental   Pulmonary neg pulmonary ROS,    breath sounds clear to auscultation       Cardiovascular hypertension,  Rhythm:regular Rate:Normal     Neuro/Psych  Headaches,    GI/Hepatic GERD  ,  Endo/Other  Hypothyroidism   Renal/GU      Musculoskeletal  (+) Arthritis ,   Abdominal   Peds  Hematology   Anesthesia Other Findings   Reproductive/Obstetrics                             Anesthesia Physical Anesthesia Plan  ASA: II  Anesthesia Plan: General   Post-op Pain Management:    Induction: Intravenous  PONV Risk Score and Plan: 3 and Ondansetron, Dexamethasone, Midazolam and Treatment may vary due to age or medical condition  Airway Management Planned: Oral ETT  Additional Equipment:   Intra-op Plan:   Post-operative Plan: Extubation in OR  Informed Consent: I have reviewed the patients History and Physical, chart, labs and discussed the procedure including the risks, benefits and alternatives for the proposed anesthesia with the patient or authorized representative who has indicated his/her understanding and acceptance.     Plan Discussed with: CRNA, Anesthesiologist and Surgeon  Anesthesia Plan Comments:         Anesthesia Quick Evaluation

## 2017-08-20 NOTE — ED Notes (Signed)
EMTALA checked for completion  

## 2017-08-20 NOTE — Telephone Encounter (Signed)
This is ok

## 2017-08-20 NOTE — ED Notes (Addendum)
Sherilyn CooterHenry RN called Short Stay at [facility] [phone]) to give report and the secretary Erskine Squibb(Jane) states that there were no nurses at this time and that nurses will get there from 5:30-5:45. [facility] is here to pick up the Pt. Reports given to [facility] team members.

## 2017-08-20 NOTE — Op Note (Signed)
OrthopaedicSurgeryOperativeNote ([id number]) Date of Surgery: 08/20/2017  Admit Date: 08/20/2017   Diagnoses: Pre-Op Diagnoses: Type 1 open right pilon fracture  Post-Op Diagnosis: Same  Procedures: 1. CPT 27828-ORIF of right pilon fracture with fibula 2. CPT 11012-I&D of open tibia fracture 3. CPT 97605-Incisional wound vac placement   Surgeons: Primary: Timea Breed, Gillie Manners, MD   Assistant: Montez Morita, PA-C  Location:[facility] OR ROOM 03   AnesthesiaGeneral   Antibiotics:Ancef 2g preop   Tourniquettime:None  EstimatedBloodLoss:100 mL   Complications:None  Specimens:None  Implants: Implant Name Type Inv. Item Serial No. Manufacturer Lot No. LRB No. Used Action  SCREW CORTEX 4.0 - [id number] Screw SCREW CORTEX 4.0  SYNTHES TRAUMA  Right 1 Implanted  medial tibia plate, 6 hole Plate   SYNTHES TRAUMA  Right 1 Implanted  screw, cortex LP 42mm Screw   SYNTHES TRAUMA  Right 1 Implanted  SCREW CORTEX LOW PRO 3.5X26 - [id number] Screw SCREW CORTEX LOW PRO 3.5X26  SYNTHES TRAUMA  Right 2 Implanted  SCREW METAPHYSEAL 2.7X38 - [id number] Screw SCREW METAPHYSEAL 2.7X38  SYNTHES TRAUMA  Right 1 Implanted  SCREW LOCKING VA 2.7X40MM - [id number] Screw SCREW LOCKING VA 2.7X40MM  SYN[employer]ES TRAUMA  Right 2 Implanted  SCREW LOCKING 2.7X14MM - [id number] Screw SCREW LOCKING 2.7X14MM  SYNTHES TRAUMA  Right 1 Implanted  SCREW CORTEX LOW PRO 3.5X24 - [id number] Screw SCREW CORTEX LOW PRO 3.5X24  SYNTHES TRAUMA  Right 1 Implanted  SCREW CORTEX 3.5 - [id number] Screw SCREW CORTEX 3.5  SYNTHES TRAUMA  Right 1 Implanted  SCREW CORTEX 3.5 - [id number] Screw SCREW CORTEX 3.5  SYNTHES TRAUMA  Right 1 Implanted  PLATE TUBULAR W/COLLAR - [id number] Plate PLATE TUBULAR W/COLLAR  SYNTHES TRAUMA  Right 1 Implanted  SCREW CORTEX 3.5 - [id number] Screw SCREW CORTEX 3.5  SYNTHES TRAUMA  Right 4 Implanted  SCREW CORTEX 3.5 - [id number] Screw SCREW CORTEX 3.5  SYNTHES  TRAUMA  Right 2 Implanted    IndicationsforSurgery: This is a 42 year old female who slipped fell on ice in her driveway.  She sustained a type I open peel on fracture with associated fibular shaft fracture.  She presented to [facility] at which point she was evaluated by the orthopedic team there and felt that her level of orthopedic care was needed and the fracture warranted treatment by a orthopedic traumatologist.  The patient was then brought to [facility] at which point I evaluated her and felt that a urgent irrigation debridement of the open fracture with possible open reduction internal fixation versus external fixation was most appropriate.  I discussed the risks and benefits with the patient and her husband. Risks discussed included bleeding requiring blood transfusion, bleeding causing a hematoma, infection, malunion, nonunion, damage to surrounding nerves and blood vessels, pain, hardware prominence or irritation, hardware failure, stiffness, post-traumatic arthritis, DVT/PE, compartment syndrome, and even death. The patient and their family agreed to proceed with surgery and consent was obtained.  Operative Findings: 1.  Two inside out puncture wounds still 1 cm each that was treated with extension of the wounds and thorough irrigation and debridement of the open fracture. 2.  Primary open reduction with internal fixation with lag screw fixation of tibia fracture and neutralization plating of the medial malleolus and tibia fracture with a Synthes 6-hole medial tibial plate 3.  Open reduction internal fixation of right fibular shaft fracture using a 6-hole one third tubular plate 4. Incisional wound vac placement  Procedure:  The patient was identified in the preoperative holding area. Consent was confirmed with the patient and their family and all questions were answered. The operative extremity was marked after confirmation with the patient. she was then brought  back to the operating room by our anesthesia colleagues.  The patient was then placed under general anesthetic.  She was carefully transferred over to a radiolucent flat top table.  Here a bump was placed under her operative hip.  The splint was taken down where the open fracture wounds were then visualized.  The operative extremity was then prepped and draped in usual sterile fashion. A preoperative timeout was performed to verify the patient, the procedure, and the extremity. Preoperative antibiotics were dosed.  I first started out by connecting the 2 small puncture wounds along the anterior medial aspect of the distal tibia.  I extended the lateral portion proximally in the medial portion distally.  A made the wound in total approximately 8-9 cm in length.  The incision was then carried down through skin and subcutaneous tissue.  The saphenous nerve was then visualized and protected throughout the case.  The fracture was then visualized.  There was periosteum that had entered into the fracture.  There was no gross contamination.  The proximal portion that had punctured the skin was then delivered in a curette was used to debride the bone and excise any loose cancellus bone that was visible.  A low pressure pulsatile lavage was then used to thoroughly irrigate the bone ends as well as the whole incision and wound.  A total of 3 L was used.  Gloves and instruments were then changed.  I then felt that the exposure to a had and the fracture pattern were amenable to definitive fixation felt that the most appropriate treatment option would be to fix it as her wound was not contaminated and her skin would be able to be closed.  At this point I used a clamp to reduce the oblique fracture of the distal tibia in anatomic position.  There was a nondisplaced medial mall fracture that entered the ankle joint as well as a small articular segment posteriorly of the tibia that reduced well after the main tibial fragment was  clamped.  I then placed two oblique lag screws from medial to lateral to provisionally fix the main tibial shaft fragment.  Fluoroscopy was obtained to show anatomic reduction of the shaft after screw placement.  I then used a 6-hole medial tibial plate and contoured this in pinned this in place along the medial side of the fracture to neutralize the main tibia fracture and to also buttress the medial malleolus fracture and provide fixation for the small area of posterior tibia.  A 2.7 mm metaphyseal screw was placed to bring the plate flush to bone.  Number of 2.7 mm locking screws were then placed in the distal fragment and medial malleolus.  A 3.5 mm nonlocking screw was then placed in the distal segment as well.  I then placed three 3.5 mm nonlocking screws into the proximal shaft segment to bring the proximal portion of the plate flush to bone.  The 2.7 mm metaphyseal screw was then replaced with a variable angle locking screw.  I then felt that fixation of the fibula would be most appropriate to provide the most rigid construct.  I made a incision over the fibula fracture carried this down through skin and subcutaneous tissue made sure I protected the superficial peroneal nerve.  I then went  down to the fracture cleaned it out of hematoma and debrided the fracture edges.  I then clamped and reduce the fracture in place.  I then placed a 6-hole one third tubular plate.  I then provided fixation with 3 screws in the proximal and 3 screws in the distal segment.  The fracture was anatomically reduced using fluoroscopy.  Final fluoroscopic images were obtained.  The incisions were then copiously irrigated.  A gram of vancomycin powder was placed in both of the wounds.  Layered closure consisting of 2-0 Monocryl and 3-0 nylon was used.  A Praveena wound VAC was placed over the medial traumatic incision to improve blood flow and prevent drainage.  The lateral incision was dressed with bacitracin ointment,  Adaptic, 4 x 4's and sterile cast padding.  A well-padded short leg splint was then placed.  She was then awoken from anesthesia and taken to the PACU in stable condition.  Post Op Plan/Instructions: The patient will be nonweightbearing to the right lower extremity.  She will continue with the incisional wound VAC until Friday.  We will take down her splint and change her dressing.  At that time she will likely be discharged home.  She will be mobilized with occupational and physical therapy.  She received Lovenox in house for DVT prophylaxis.  She will receive postoperative Ancef.  I was present and performed the entire surgery.  Montez Morita, PA-C did assist me throughout the case. An assistant was necessary given the difficulty in approach, maintenance of reduction and ability to instrument the fracture.   Truitt Merle, MD [facility]

## 2017-08-20 NOTE — ED Notes (Signed)
Pt left the ED via [facility]. Report given to [facility]. [facility] stated that they will give report to Short Stay at [facility] when they drop off the Pt since there are no Nurses in Short Stay at this time.

## 2017-08-20 NOTE — H&P (Signed)
Orthopaedic Trauma Service (OTS) Consult   Patient ID: Erika SettleJennifer A Hart MRN: [id number] DOB/AGE: March 01, 1976 42 y.o.  Reason for Consult:Right open pilon fracture Referring Physician: Juanell FairlyKevin Krasinski, MD [facility]  HPI: Erika SettleJennifer A Hart is an 42 y.o. female who is being seen in consultation at the request of Dr. Martha ClanKrasinski  for evaluation of right open distal tibia and fibula fractures.  This is a healthy 42 year old female with a history of hypertension and hypothyroidism that slipped on ice in the driveway and had immediate pain and deformity.  She was unable to bear weight and had a open wound.  She was [facility] where x-rays obtained show a distal tibia and fibula fracture.  She was seen by Dr. Lesleigh NoeKosinski and placed in a splint.  He reached out to us for definitive treatment due to the complex nature of the fracture and need for orthopedic traumatologist.  She denies any pain anywhere else and denies any other injuries.  She denies any numbness or tingling in her leg.  Her husband is at bedside with her today.  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  Past Medical History:  Diagnosis Date  . Allergy   . Arthritis   . GERD (gastroesophageal reflux disease)   . Hypertension   . Thyroid disease     Past Surgical History:  Procedure Laterality Date  . CESAREAN SECTION  2004    Family History  Problem Relation Age of Onset  . Thyroid disease Mother   . Diabetes Father   . Stroke Father   . Healthy Daughter   . Breast cancer Neg Hx     Social History:  reports that  has never smoked. she has never used smokeless tobacco. She reports that she drinks alcohol. She reports that she does not use drugs.  Allergies:  Allergies  Allergen Reactions  . Sulfa Antibiotics Rash    Medications:  No current facility-administered medications on file prior to encounter.    Current Outpatient Medications on File Prior to  Encounter  Medication Sig Dispense Refill  . Aspirin-Acetaminophen-Caffeine (EXCEDRIN MIGRAINE PO) Take by mouth.    . diclofenac (VOLTAREN) 75 MG EC tablet TAKE 1 TABLET (75 MG TOTAL) BY MOUTH 2 (TWO) TIMES DAILY WITH MEALS. (Patient taking differently: as needed. TAKE 1 TABLET (75 MG TOTAL) BY MOUTH 2 (TWO) TIMES DAILY WITH MEALS.) 60 tablet 6  . hydrochlorothiazide (MICROZIDE) 12.5 MG capsule Take 1 capsule (12.5 mg total) by mouth daily. 90 capsule 1  . levothyroxine (SYNTHROID, LEVOTHROID) 112 MCG tablet Take 1 tablet (112 mcg total) by mouth daily. 30 tablet 3    ROS: Constitutional: No fever or chills Vision: No changes in vision ENT: No difficulty swallowing CV: No chest pain Pulm: No SOB or wheezing GI: No nausea or vomiting GU: No urgency or inability to hold urine Skin: No poor wound healing Neurologic: No numbness or tingling Psychiatric: No depression or anxiety Heme: No bruising Allergic: No reaction to medications or food   Exam: Last menstrual period 08/19/2017.  General: No acute distress Orientation: Awake alert and oriented x3 Mood and Affect: Cooperative and pleasant Gait:  unable to assess due to fracture Coordination and balance: Within normal limits.  Right lower extremity: shows splint that is in place the compartments are soft and compressible.  She is able to wiggle her toes and has sensation intact to the dorsum and plantar surface of her toes.  She has a warm well-perfused toes  with brisk cap refill.  No lymphadenopathy and normal reflexes.  No pain or deformity about the knee or hip.  Left lower extremity: Skin without lesions. No tenderness to palpation. Full painless ROM, full strength in each muscle groups without evidence of instability.   Medical Decision Making: Imaging: X-rays of her right ankle show a distal tibial fracture with associated medial malleolus fracture.  There is a fibular shaft fracture as well.  The CT scan was reviewed which  shows a intra-articular extension along the medial side with a little bit of comminution of the posterior medial malleolus.  There is notable soft tissue gas along the fracture line.   Labs:  CBC    Component Value Date/Time   WBC 7.8 08/20/2017 0230   RBC 4.37 08/20/2017 0230   HGB 13.5 08/20/2017 0230   HGB 15.2 08/15/2017 0939   HCT 39.1 08/20/2017 0230   HCT 44.7 08/15/2017 0939   PLT 249 08/20/2017 0230   PLT 313 08/15/2017 0939   MCV 89.6 08/20/2017 0230   MCV 90 08/15/2017 0939   MCH 30.9 08/20/2017 0230   MCHC 34.4 08/20/2017 0230   RDW 12.4 08/20/2017 0230   RDW 13.3 08/15/2017 0939   LYMPHSABS 1.0 08/20/2017 0230   LYMPHSABS 1.4 08/15/2017 0939   MONOABS 0.8 08/20/2017 0230   EOSABS 0.1 08/20/2017 0230   EOSABS 0.1 08/15/2017 0939   BASOSABS 0.0 08/20/2017 0230   BASOSABS 0.0 08/15/2017 1610    Medical history and chart was reviewed  Assessment/Plan: 42 year old female with a history of hypothyroidism and hypertension with an open type I distal tibia and fibular fracture   I discussed at length the risks and benefits of proceeding with surgical fixation.  We will need to do an irrigation debridement of her right lower extremity and possible external fixation versus open reduction internal fixation depending on the swelling.  Risks discussed included bleeding requiring blood transfusion, bleeding causing a hematoma, infection, malunion, nonunion, damage to surrounding nerves and blood vessels, pain, hardware prominence or irritation, hardware failure, stiffness, post-traumatic arthritis, DVT/PE, compartment syndrome, and even death.  She agrees to proceed and consent was obtained.   Roby Lofts, MD [facility] [phone] (phone)

## 2017-08-21 LAB — CBC
HCT: 34 % — ABNORMAL LOW (ref 36.0–46.0)
Hemoglobin: 11.6 g/dL — ABNORMAL LOW (ref 12.0–15.0)
MCH: 30.9 pg (ref 26.0–34.0)
MCHC: 34.1 g/dL (ref 30.0–36.0)
MCV: 90.4 fL (ref 78.0–100.0)
PLATELETS: 263 10*3/uL (ref 150–400)
RBC: 3.76 MIL/uL — AB (ref 3.87–5.11)
RDW: 12.5 % (ref 11.5–15.5)
WBC: 9.6 10*3/uL (ref 4.0–10.5)

## 2017-08-21 LAB — CREATININE, SERUM: CREATININE: 0.82 mg/dL (ref 0.44–1.00)

## 2017-08-21 MED ORDER — METHOCARBAMOL 1000 MG/10ML IJ SOLN: 500.0000 mg | Freq: Four times a day (QID) | INTRAVENOUS | Status: DC | PRN

## 2017-08-21 MED ORDER — METHOCARBAMOL 500 MG PO TABS
500.0000 mg | ORAL_TABLET | Freq: Four times a day (QID) | ORAL | Status: DC | PRN
  Administered 2017-08-22: 500 mg via ORAL
  Filled 2017-08-21: qty 1

## 2017-08-21 MED ORDER — KETOROLAC TROMETHAMINE 15 MG/ML IJ SOLN
15.0000 mg | Freq: Three times a day (TID) | INTRAMUSCULAR | Status: DC
  Administered 2017-08-21 – 2017-08-22 (×3): 15 mg via INTRAVENOUS
  Filled 2017-08-21 (×3): qty 1

## 2017-08-21 MED ORDER — HYDROCODONE-ACETAMINOPHEN 7.5-325 MG PO TABS
1.0000 | ORAL_TABLET | Freq: Four times a day (QID) | ORAL | Status: DC | PRN
  Administered 2017-08-21 – 2017-08-22 (×3): 2 via ORAL
  Filled 2017-08-21 (×3): qty 2

## 2017-08-21 NOTE — Evaluation (Signed)
Occupational Therapy Evaluation Patient Details Name: Erika Hart MRN: [id number] DOB: 1976/05/27 Today's Date: 08/21/2017    History of Present Illness 42 yo female who slipped fell on ice in her driveway.  She sustained a type I open peel on fracture with associated fibular shaft fracture. Now s/p ORIF of right pilon fracture with fibula, I&D of open tibia fracture with Incisional wound vac placement.   Clinical Impression   Pt admitted with the above diagnoses and presents with below problem list. Pt will benefit from continued acute OT to address the below listed deficits and maximize independence with basic ADLs prior to d/c home. PTA pt was independent with ADLs and works in a Theme park managerdental office. Pt is currently min to mod A with LB ADLs and functional transfers. Pt tolerated session fairly well and was able to maintain NWB RLE. Spouse present and involved in session. Bed mobility and SPT EOB>BSC>recliner completed during session.      Follow Up Recommendations  No OT follow up    Equipment Recommendations  3 in 1 bedside commode    Recommendations for Other Services PT consult     Precautions / Restrictions Precautions Precaution Comments: wound vac Restrictions Weight Bearing Restrictions: Yes RLE Weight Bearing: Non weight bearing      Mobility Bed Mobility Overal bed mobility: Needs Assistance Bed Mobility: Supine to Sit     Supine to sit: Mod assist;HOB elevated;Min assist     General bed mobility comments: assist to advance RLE  Transfers Overall transfer level: Needs assistance Equipment used: Rolling walker (2 wheeled) Transfers: Sit to/from UGI CorporationStand;Stand Pivot Transfers Sit to Stand: Min assist Stand pivot transfers: Min assist       General transfer comment: EOB>BSC>recliner. Cues for technique with rw. Able to maintain NWB RLE.     Balance Overall balance assessment: Needs assistance Sitting-balance support: Feet supported;Bilateral upper  extremity supported Sitting balance-Leahy Scale: Fair     Standing balance support: Bilateral upper extremity supported;During functional activity Standing balance-Leahy Scale: Poor Standing balance comment: rw for support                           ADL either performed or assessed with clinical judgement   ADL Overall ADL's : Needs assistance/impaired Eating/Feeding: Set up;Sitting   Grooming: Set up;Sitting   Upper Body Bathing: Set up;Sitting   Lower Body Bathing: Moderate assistance;Sit to/from stand   Upper Body Dressing : Set up;Sitting   Lower Body Dressing: Moderate assistance;Sit to/from stand   Toilet Transfer: Minimal assistance;Stand-pivot;BSC;RW   Toileting- Clothing Manipulation and Hygiene: Set up;Sitting/lateral lean;Moderate assistance;Sit to/from stand       Functional mobility during ADLs: Minimal assistance;Rolling walker(pivotol steps) General ADL Comments: Pt compelted bed mobility and EOB>BSC>recliner. Also completed anterior pericare in seated position.      Vision         Perception     Praxis      Pertinent Vitals/Pain Pain Assessment: Faces Faces Pain Scale: Hurts even more Pain Location: RLE Pain Descriptors / Indicators: Aching;Sore;Throbbing;Guarding Pain Intervention(s): Limited activity within patient's tolerance;Monitored during session;Repositioned;Patient requesting pain meds-RN notified     Hand Dominance     Extremity/Trunk Assessment Upper Extremity Assessment Upper Extremity Assessment: Overall WFL for tasks assessed   Lower Extremity Assessment Lower Extremity Assessment: Defer to PT evaluation       Communication Communication Communication: No difficulties   Cognition Arousal/Alertness: Awake/alert Behavior During Therapy: WFL for tasks assessed/performed Overall Cognitive  Status: Within Functional Limits for tasks assessed                                     General Comments   Spouse present and involved in session.    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        OT Problem List: Impaired balance (sitting and/or standing);Decreased knowledge of use of DME or AE;Decreased knowledge of precautions;Pain      OT Treatment/Interventions: Self-care/ADL training;DME and/or AE instruction;Therapeutic activities;Patient/family education;Balance training    OT Goals(Current goals can be found in the care plan section) Acute Rehab OT Goals Patient Stated Goal: improved mobility, functional independence OT Goal Formulation: With patient/family Time For Goal Achievement: 08/28/17 Potential to Achieve Goals: Good ADL Goals Pt Will Perform Lower Body Bathing: with min guard assist;with adaptive equipment;sit to/from stand Pt Will Perform Lower Body Dressing: with min guard assist;sit to/from stand Pt Will Transfer to Toilet: with min guard assist;ambulating Pt Will Perform Toileting - Clothing Manipulation and hygiene: with min guard assist;with min assist;sit to/from stand Pt Will Perform Tub/Shower Transfer: Shower transfer;with min guard assist;ambulating;3 in 1;rolling walker Additional ADL Goal #1: Pt will complete bed mobilty at mod I level to prepare for OOB ADLs.  OT Frequency: Min 2X/week   Barriers to D/C:            Co-evaluation              AM-PAC PT "6 Clicks" Daily Activity     Outcome Measure Help from another person eating  meals?: None Help from another person taking care of personal grooming?: None Help from another person toileting, which includes using toliet, bedpan, or urinal?: A Little Help from another person bathing (including washing, rinsing, drying)?: A Lot Help from another person to put on and taking off regular upper body clothing?: None Help from another person to put on and taking off regular lower body clothing?: A Lot 6 Click Score: 19   End of Session Equipment Utilized During Treatment: Engineer, water Communication: Patient requests pain meds  Activity Tolerance: Patient tolerated treatment well Patient left: in chair;with call bell/phone within reach;with family/visitor present  OT Visit Diagnosis: Unsteadiness on feet (R26.81);Pain Pain - Right/Left: Right Pain - part of body: Leg                Time: 1610-9604 OT Time Calculation (min): 32 min Charges:  OT General Charges $OT Visit: 1 Visit OT Evaluation $OT Eval Low Complexity: 1 Low OT Treatments $Self Care/Home Management : 8-22 mins G-Codes:       Pilar Grammes 08/21/2017, 12:45 PM

## 2017-08-21 NOTE — Progress Notes (Signed)
Orthopedic Trauma Service Progress Note   Patient ID: Nehemiah SettleJennifer A Skalsky MRN: [id number] DOB/AGE: Jun 28, 1976 42 y.o.  Subjective:  Doing ok this am R ankle hurting quite a bit  Has taken oxycodone in the past for her back and states this did nothing for her  Got most relief with NSAIDs   Has not been out of bed yet  Appetite is ok   No other complaints    Review of Systems  Constitutional: Negative for chills and fever.  Respiratory: Negative for shortness of breath and wheezing.   Cardiovascular: Negative for chest pain and palpitations.  Gastrointestinal: Negative for abdominal pain, nausea and vomiting.    Objective:   VITALS:   Vitals:   08/20/17 1322 08/20/17 1330 08/20/17 2028 08/21/17 0457  BP: 112/78  122/72 113/79  Pulse: 65  83 (!) 107  Resp: 13  16 16   Temp:  (!) 97.5 F (36.4 C) 99.2 F (37.3 C) (!) 97.4 F (36.3 C)  TempSrc:   Oral Oral  SpO2: 99%  96% 100%    Estimated body mass index is 33.47 kg/m as calculated from the following:   Height as of 08/15/17: 5\' 2"  (1.575 m).   Weight as of 08/15/17: 83 kg (183 lb).   Intake/Output      01/23 0701 - 01/24 0700 01/24 0701 - 01/25 0700   P.O.  240   I.V. 1187.5    IV Piggyback 250    Total Intake 1437.5 240   Blood 100    Total Output 100    Net +1337.5 +240        Urine Occurrence 1 x      Estimated body mass index is 33.47 kg/m as calculated from the following:   Height as of 08/15/17: 5\' 2"  (1.575 m).   Weight as of 08/15/17: 83 kg (183 lb).   LABS  Results for orders placed or performed during the hospital encounter of 08/20/17 (from the past 24 hour(s))  CBC     Status: Abnormal   Collection Time: 08/20/17 11:04 PM  Result Value Ref Range   WBC 9.2 4.0 - 10.5 K/uL   RBC 3.75 (L) 3.87 - 5.11 MIL/uL   Hemoglobin 11.6 (L) 12.0 - 15.0 g/dL   HCT 46.933.5 (L) 62.936.0 - 52.846.0 %   MCV 89.3 78.0 - 100.0 fL   MCH 30.9 26.0 - 34.0 pg   MCHC 34.6 30.0 - 36.0 g/dL   RDW  41.312.5 24.411.5 - 01.015.5 %   Platelets 271 150 - 400 K/uL  Creatinine, serum     Status: None   Collection Time: 08/20/17 11:04 PM  Result Value Ref Range   Creatinine, Ser 0.82 0.44 - 1.00 mg/dL   GFR calc non Af Amer >60 >60 mL/min   GFR calc Af Amer >60 >60 mL/min  CBC     Status: Abnormal   Collection Time: 08/21/17  8:50 AM  Result Value Ref Range   WBC 9.6 4.0 - 10.5 K/uL   RBC 3.76 (L) 3.87 - 5.11 MIL/uL   Hemoglobin 11.6 (L) 12.0 - 15.0 g/dL   HCT 27.234.0 (L) 53.636.0 - 64.446.0 %   MCV 90.4 78.0 - 100.0 fL   MCH 30.9 26.0 - 34.0 pg   MCHC 34.1 30.0 - 36.0 g/dL   RDW 03.412.5 74.211.5 - 59.515.5 %   Platelets 263 150 - 400 K/uL     PHYSICAL EXAM:   Gen: resting comfortably in bed, slightly anxious regarding pain control. Her  concerns appear genuine and not for secondary gain  Lungs: clear B  Cardiac: RRR, s1 and s2 Abd: + BS, NTND Ext:       Right Lower Extremity   Splint c/d/i   Fitting well  Swelling stable in R leg  Ext warm   + DP pulse  DPN, SPN, TN sensation intact  EHL, FHL, lesser toe motor functions intact  No out of proportion with passive stretching   Assessment/Plan: 1 Day Post-Op   Principal Problem:   Pilon fracture of right tibia, open type I or II, initial encounter Active Problems:   BP (high blood pressure)   Acquired hypothyroidism   Anti-infectives (From admission, onward)   Start     Dose/Rate Route Frequency Ordered Stop   08/20/17 1600  ceFAZolin (ANCEF) IVPB 2g/100 mL premix     2 g 200 mL/hr over 30 Minutes Intravenous Every 8 hours 08/20/17 1331 08/21/17 1559   08/20/17 0930  vancomycin (VANCOCIN) powder  Status:  Discontinued       As needed 08/20/17 0930 08/20/17 1002    .  POD/HD#: 1  42 y/o female s/p fall with open R pilon fracture, tibia and fibula   - fall   -R pilon fracture, tibia and fibula s/p ORIF  NWB R leg x 6-8 weeks  Will do dressing change tomorrow and remove prevena   PT/OT evals  Titrate pain control     Ice and elevate leg      - Pain management:  Will switch pt to Norco and add toradol   Ice and elevation   Pt was having some tingling, may consider adding neuropathic agent but hold for now. Do not want to make too many changes at once  - ABL anemia/Hemodynamics  Stable  - Medical issues   HTN   BPs look good   - DVT/PE prophylaxis:  Lovenox as inpatient  Probably dc home on ASA, 325 mg daily x 4 weeks   - ID:   periop abx for open fx treatment   - Activity:  NWB R leg  Up with assistance    - FEN/GI prophylaxis/Foley/Lines:  Reg diet    - Impediments to fracture healing:  Open fracture   - Dispo:  PT/OT evals  Optimize pain control   Hopeful for dc home tomorrow     Mearl Latin, PA-C [facility] [phone] ([phone] Traci Sermon (C) 08/21/2017, 10:09 AM

## 2017-08-21 NOTE — Evaluation (Signed)
Physical Therapy Evaluation Patient Details Name: Erika SettleJennifer A Hart MRN: [id number] DOB: 08/27/1975 Today's Date: 08/21/2017   History of Present Illness  42 yo female who slipped fell on ice in her driveway.  She sustained a type I open pilon fracture with associated fibular shaft fracture. Now s/p ORIF of right pilon fracture with fibula, I&D of open tibia fracture with Incisional wound vac placement.  Clinical Impression  Pt is POD #1 and mobilizing well with RW or knee walker.  She would benefit from stair training prior to d/c as her mobile home has STE.  She is planning on staying with her mom for a week until she feels a bit stronger, but stair training would be beneficial so that she knows how to safely enter her home when she decides she wants to go back.   PT to follow acutely for deficits listed below.       Follow Up Recommendations No PT follow up;Supervision for mobility/OOB    Equipment Recommendations  None recommended by PT(pt has access to equipment)    Recommendations for Other Services   NA    Precautions / Restrictions Precautions Precaution Comments: wound vac Restrictions Weight Bearing Restrictions: Yes RLE Weight Bearing: Non weight bearing      Mobility  Bed Mobility Overal bed mobility: Needs Assistance Bed Mobility: Supine to Sit     Supine to sit: Supervision     General bed mobility comments: Supervision to get to EOB.  Pt using hands to help progress R leg over EOB.   Transfers Overall transfer level: Needs assistance Equipment used: Rolling walker (2 wheeled) Transfers: Sit to/from Stand Sit to Stand: Min guard         General transfer comment: Min guard assist for safety  Ambulation/Gait Ambulation/Gait assistance: Min guard Ambulation Distance (Feet): 40 Feet(20'x2 once with RW, once with knee walker) Assistive device: Rolling walker (2 wheeled)(knee walker) Gait Pattern/deviations: Step-to pattern(hop to)     General Gait  Details: Ambulated to and from the bathroom with RW, min guard assist for safety and balance, then, practiced ambulating with the knee walker around the room, also requiring min guard assist for safety.          Balance Overall balance assessment: Needs assistance Sitting-balance support: Feet supported;No upper extremity supported Sitting balance-Leahy Scale: Good     Standing balance support: Bilateral upper extremity supported;During functional activity Standing balance-Leahy Scale: Poor Standing balance comment: RW or knee walker and min guard assist for support.                              Pertinent Vitals/Pain Pain Assessment: 0-10 Faces Pain Scale: Hurts even more Pain Location: RLE Pain Descriptors / Indicators: Aching;Sore;Throbbing;Guarding Pain Intervention(s): Limited activity within patient's tolerance;Monitored during session;Repositioned    Home Living Family/patient expects to be discharged to:: Private residence Living Arrangements: Spouse/significant other Available Help at Discharge: Family Type of Home: House Home Access: Stairs to enter Entrance Stairs-Rails: Right;Left;Can reach both Secretary/administratorntrance Stairs-Number of Steps: 5 Home Layout: One level Home Equipment: None      Prior Function Level of Independence: Independent         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  Pt can wiggle toes and feel her toes.  She has normal knee and hip strength on this side.        Communication   Communication: No difficulties  Cognition Arousal/Alertness: Awake/alert Behavior During Therapy: WFL for tasks assessed/performed Overall Cognitive Status: Within Functional Limits for tasks  assessed                                           Exercises Other Exercises Other Exercises: advised pt to wiggle toes regularly, SLR, knee bends and LAQs encouraged as well.    Assessment/Plan    PT Assessment Patient needs continued PT services  PT Problem List Decreased strength;Decreased range of motion;Decreased balance;Decreased activity tolerance;Decreased mobility;Decreased knowledge of use of DME;Pain       PT Treatment Interventions DME instruction;Gait training;Stair training;Functional mobility training;Therapeutic activities;Therapeutic exercise;Balance training;Patient/family education    PT Goals (Current goals can be found in the Care Plan section)  Acute Rehab PT Goals Patient Stated Goal: get back to work PT Goal Formulation: With patient Time For Goal Achievement: 08/28/17 Potential to Achieve Goals: Good    Frequency Min 5X/week           AM-PAC PT "6 Clicks" Daily Activity  Outcome Measure Difficulty turning over in bed (including adjusting bedclothes, sheets and blankets)?: A Little Difficulty moving from lying on back to sitting on the side of the bed? : A Little Difficulty sitting down on and standing up from a chair with arms (e.g., wheelchair, bedside commode, etc,.)?: Unable Help needed moving to and from a bed to chair (including a wheelchair)?: A Little Help needed walking in hospital room?: A Little Help needed climbing 3-5 steps with a railing? : A Lot 6 Click Score: 15    End of Session   Activity Tolerance: Patient tolerated treatment well Patient left: in bed;with call bell/phone within reach   PT Visit Diagnosis: Difficulty in walking, not elsewhere classified (R26.2);Pain Pain - Right/Left: Right Pain - part of body: Leg    Time: 1610-9604 PT Time Calculation (min) (ACUTE ONLY): 17 min   Charges:       Lurena Joiner B. Hrishikesh Hoeg, PT, DPT [phone]   PT Evaluation $PT Eval Low Complexity: 1 Low      08/21/2017, 9:25 PM

## 2017-08-22 ENCOUNTER — Encounter (HOSPITAL_COMMUNITY): Payer: Self-pay | Admitting: Student

## 2017-08-22 MED ORDER — ASPIRIN EC 325 MG PO TBEC: 325.0000 mg | DELAYED_RELEASE_TABLET | Freq: Every day | ORAL | 0 refills | Status: AC

## 2017-08-22 MED ORDER — METHOCARBAMOL 500 MG PO TABS: 500.0000 mg | ORAL_TABLET | Freq: Four times a day (QID) | ORAL | 0 refills | Status: DC | PRN

## 2017-08-22 MED ORDER — HYDROCODONE-ACETAMINOPHEN 7.5-325 MG PO TABS: 1.0000 | ORAL_TABLET | Freq: Four times a day (QID) | ORAL | 0 refills | Status: DC | PRN

## 2017-08-22 NOTE — Care Management (Signed)
Patient has no Home Health or DME needs, has access to DME should she need it.

## 2017-08-22 NOTE — Progress Notes (Signed)
Orthopaedic Trauma Progress Note  S: Pain is much better controlled.  No issues overnight.  Did well with therapy yesterday  O:  Vitals:   08/21/17 2100 08/22/17 0641  BP: (!) 94/57 115/70  Pulse: 87 77  Resp: 16 16  Temp: 99 F (37.2 C) 98.7 F (37.1 C)  SpO2: 96% 99%   Right lower extremity splint taken down incisions are clean dry and intact.  It was redressed with Adaptic and 4 x 4's.  Compartments are soft and compressible.  Endorses sensation grossly intact to all nerve distributions.  Warm well-perfused foot.  Active dorsiflexion plantarflexion although stiff.  Labs:  CBC    Component Value Date/Time   WBC 9.6 08/21/2017 0850   RBC 3.76 (L) 08/21/2017 0850   HGB 11.6 (L) 08/21/2017 0850   HGB 15.2 08/15/2017 0939   HCT 34.0 (L) 08/21/2017 0850   HCT 44.7 08/15/2017 0939   PLT 263 08/21/2017 0850   PLT 313 08/15/2017 0939   MCV 90.4 08/21/2017 0850   MCV 90 08/15/2017 0939   MCH 30.9 08/21/2017 0850   MCHC 34.1 08/21/2017 0850   RDW 12.5 08/21/2017 0850   RDW 13.3 08/15/2017 0939   LYMPHSABS 1.0 08/20/2017 0230   LYMPHSABS 1.4 08/15/2017 0939   MONOABS 0.8 08/20/2017 0230   EOSABS 0.1 08/20/2017 0230   EOSABS 0.1 08/15/2017 0939   BASOSABS 0.0 08/20/2017 0230   BASOSABS 0.0 08/15/2017 093789    A/P: 42 year old female status post I&D and ORIF of right open pilon fracture  -Nonweightbearing right lower extremity. -We will place in boot today -PT OT for stairs today -Discharge home later this evening  Roby LoftsKevin P. Darcee Dekker, MD [facility] [phone] (phone)

## 2017-08-22 NOTE — Progress Notes (Signed)
Removed IV, provided discharge education/instructions, all questions and concerns addressed, Pt not in distress, discharged home with belongings accompanied by husband. 

## 2017-08-22 NOTE — Discharge Summary (Signed)
Orthopaedic Trauma Service (OTS)  Patient ID: Erika SettleJennifer A Hart MRN: [id number] DOB/AGE: [birth year] 44 y.o.  Admit date: 08/20/2017 Discharge date: 08/22/2017  Admission Diagnoses: Pilon fracture of right tibia, open type I or II, initial encounter  Discharge Diagnoses:  Principal Problem:   Pilon fracture of right tibia, open type I or II, initial encounter Active Problems:   BP (high blood pressure)   Acquired hypothyroidism   Past Medical History:  Diagnosis Date  . Allergy   . Arthritis   . GERD (gastroesophageal reflux disease)   . Hypertension   . Thyroid disease      Procedures Performed: 1. CPT 27828-ORIF of right pilon fracture with fibula 2. CPT 11012-I&D of open tibia fracture 3. CPT 97605-Incisional wound vac placement   Discharged Condition: good  Hospital Course: The patient was brought from [facility] and underwent an I&D as well as an ORIF of her right pilon fracture.  Is placed in a splint postoperatively.  She did well.  Her pain was eventually controlled with oral medications.  She mobilize with physical and occupational therapy.  She was cleared to discharge home.  Upon discharge on postoperative day 2 the incision and dressing were checked.  There were no signs of any concern for infection wound breakdown.  Voiding regularly, she was tolerating regular diet and her pain was well controlled on oral medications.  She was discharged home on postoperative day 2.  Consults: None  Significant Diagnostic Studies: None  Treatments: surgery: as noted above  Discharge Exam: Vitals:   08/21/17 2100 08/22/17 0641  BP: (!) 94/57 115/70  Pulse: 87 77  Resp: 16 16  Temp: 99 F (37.2 C) 98.7 F (37.1 C)  SpO2: 96% 99%   Gen: NAD, AAOx3, cooperative and pleasant  Right lower extremity splint taken down incisions are clean dry and intact.  It was redressed with Adaptic and 4 x 4's.  Compartments are soft and compressible.  Endorses sensation  grossly intact to all nerve distributions.  Warm well-perfused foot.  Active dorsiflexion plantarflexion although stiff.  Disposition: Home  Allergies as of 08/22/2017      Reactions   Sulfa Antibiotics Rash      Medication List    STOP taking these medications   diclofenac 75 MG EC tablet Commonly known as:  VOLTAREN     TAKE these medications   acetaminophen 500 MG tablet Commonly known as:  TYLENOL Take 500 mg by mouth every 6 (six) hours as needed for headache.   aspirin EC 325 MG tablet Take 1 tablet (325 mg total) by mouth daily.   EXCEDRIN MIGRAINE PO Take by mouth.   hydrochlorothiazide 12.5 MG capsule Commonly known as:  MICROZIDE Take 1 capsule (12.5 mg total) by mouth daily.   HYDROcodone-acetaminophen 7.5-325 MG tablet Commonly known as:  NORCO Take 1-2 tablets by mouth every 6 (six) hours as needed for moderate pain or severe pain.   levothyroxine 112 MCG tablet Commonly known as:  SYNTHROID, LEVOTHROID Take 1 tablet (112 mcg total) by mouth daily.   methocarbamol 500 MG tablet Commonly known as:  ROBAXIN Take 1-2 tablets (500-1,000 mg total) by mouth every 6 (six) hours as needed for muscle spasms.   VITAMIN D PO Take 1 tablet by mouth daily.      Follow-up Information    Jorryn Hershberger, Gillie MannersKevin P, MD. Schedule an appointment as soon as possible for a visit in 2 week(s).   Specialty:  Orthopedic Surgery Contact information[address] [place] [phone]           Discharge Instructions and Plan: The patient will be nonweightbearing to the right lower extremity.  She will be placed in the boot.  She will return to see me in 1-2 weeks for x-rays and suture removal.  She will be on aspirin for DVT prophylaxis.  She will be nonweightbearing for likely 6-8 weeks.  Signed:  Roby Lofts, MD [facility] [phone] (phone)  08/22/2017, 9:43 AM

## 2017-08-22 NOTE — Progress Notes (Addendum)
Occupational Therapy Treatment Patient Details Name: CORYN MOSSO MRN: [id number] DOB: 1976/02/04 Today's Date: 08/22/2017    History of present illness 42 yo female who slipped fell on ice in her driveway.  She sustained a type I open pilon fracture with associated fibular shaft fracture. Now s/p ORIF of right pilon fracture with fibula, I&D of open tibia fracture with Incisional wound vac placement.   OT comments  Pt and spouse without further questions. Pt states "I am just ready to get out of here". Ot education complete.    Follow Up Recommendations  No OT follow up    Equipment Recommendations  3 in 1 bedside commode    Recommendations for Other Services PT consult    Precautions / Restrictions Precautions Precautions: None Required Braces or Orthoses: Other Brace/Splint Other Brace/Splint: CAM boot R Foot Restrictions Weight Bearing Restrictions: Yes RLE Weight Bearing: Non weight bearing       Mobility Bed Mobility               General bed mobility comments: oob in chair on arrival  Transfers                      Balance                                           ADL either performed or assessed with clinical judgement   ADL                       Lower Body Dressing: Supervision/safety Lower Body Dressing Details (indicate cue type and reason): pt able to don pants and don Cam boot on R foot. pt with heel not in the bottom of the boot completely. pt educated on proper fit this session. pt advised once in standing to tighten the straps on the brace to help keep ankle flexion               General ADL Comments: educated on car transfer and positioning. pt plans to sit in the back seat with seat belt in a long sitting position Reviewed bathing and ensuring that dressing stays dry. Pt plans to use her mothers handicap shower next door. Pt plans to stay at mothers house mainly.     Vision       Perception      Praxis      Cognition Arousal/Alertness: Awake/alert Behavior During Therapy: WFL for tasks assessed/performed Overall Cognitive Status: Within Functional Limits for tasks assessed                                          Exercises     Shoulder Instructions       General Comments      Pertinent Vitals/ Pain       Pain Assessment: 0-10 Pain Score: 7  Pain Location: RLE Pain Descriptors / Indicators: Aching;Sore;Throbbing;Guarding Pain Intervention(s): Limited activity within patient's tolerance;Monitored during session;Premedicated before session;Repositioned  Home Living                                          Prior Functioning/Environment  Frequency  Min 2X/week        Progress Toward Goals  OT Goals(current goals can now be found in the care plan section)  Progress towards OT goals: Progressing toward goals  Acute Rehab OT Goals Patient Stated Goal: get back to work OT Goal Formulation: With patient/family Time For Goal Achievement: 08/28/17 Potential to Achieve Goals: Good ADL Goals Pt Will Perform Lower Body Bathing: with min guard assist;with adaptive equipment;sit to/from stand Pt Will Perform Lower Body Dressing: with min guard assist;sit to/from stand Pt Will Transfer to Toilet: with min guard assist;ambulating Pt Will Perform Toileting - Clothing Manipulation and hygiene: with min guard assist;with min assist;sit to/from stand Pt Will Perform Tub/Shower Transfer: Shower transfer;with min guard assist;ambulating;3 in 1;rolling walker Additional ADL Goal #1: Pt will complete bed mobilty at mod I level to prepare for OOB ADLs.  Plan Discharge plan remains appropriate    Co-evaluation                 AM-PAC PT "6 Clicks" Daily Activity     Outcome Measure   Help from another person eating meals?: None Help from another person taking care of personal grooming?: None Help from another  person toileting, which includes using toliet, bedpan, or urinal?: A Little Help from another person bathing (including washing, rinsing, drying)?: A Little Help from another person to put on and taking off regular upper body clothing?: None Help from another person to put on and taking off regular lower body clothing?: A Little 6 Click Score: 21    End of Session Equipment Utilized During Treatment: Rolling walker  OT Visit Diagnosis: Unsteadiness on feet (R26.81);Pain Pain - Right/Left: Right Pain - part of body: Leg   Activity Tolerance Patient tolerated treatment well   Patient Left in chair;with call bell/phone within reach;with family/visitor present   Nurse Communication Patient requests pain meds        Time: [id number] OT Time Calculation (min): 9 min  Charges: OT General Charges $OT Visit: 1 Visit OT Treatments $Self Care/Home Management : 8-22 mins   Mateo FlowJones, Brynn   OTR/L Pager: [phone] Office: [phone] .    Boone MasterJones, Sameena Artus B 08/22/2017, 3:57 PM

## 2017-08-22 NOTE — Progress Notes (Addendum)
Physical Therapy Treatment Patient Details Name: Nehemiah SettleJennifer A Mode MRN: [id number] DOB: 06-27-1976 Today's Date: 08/22/2017    History of Present Illness 42 yo female who slipped fell on ice in her driveway.  She sustained a type I open pilon fracture with associated fibular shaft fracture. Now s/p ORIF of right pilon fracture with fibula, I&D of open tibia fracture with Incisional wound vac placement.    PT Comments    Pt is progressing well with mobility and we were able to practice stairs several different ways with husband present and assisting.  Pt remains anxious about the stairs at home (as they are higher).  She may decide to stay with her mom where she can use a ramp for home entry for a while.  PT to follow acutely until d/c confirmed.      Follow Up Recommendations  No PT follow up;Supervision for mobility/OOB     Equipment Recommendations  None recommended by PT;Other (comment)(pt says she has access to equipment needed)    Recommendations for Other Services   NA     Precautions / Restrictions Precautions Precautions: None Precaution Comments: wound vac d/c this AM Required Braces or Orthoses: Other Brace/Splint Other Brace/Splint: CAM boot R Foot Restrictions Weight Bearing Restrictions: Yes RLE Weight Bearing: Non weight bearing    Mobility  Bed Mobility Overal bed mobility: Needs Assistance Bed Mobility: Supine to Sit;Sit to Supine     Supine to sit: Modified independent (Device/Increase time)        Transfers Overall transfer level: Needs assistance Equipment used: Rolling walker (2 wheeled) Transfers: Sit to/from Stand Sit to Stand: Supervision         General transfer comment: supervision for safety  Ambulation/Gait Ambulation/Gait assistance: Supervision Ambulation Distance (Feet): 250 Feet Assistive device: (knee walker) Gait Pattern/deviations: Step-to pattern(hop to) Gait velocity: decreased Gait velocity interpretation: Below normal  speed for age/gender General Gait Details: ambulated from gym to room with knee walker, short trip to the bathroom before session with RW.    Stairs Stairs: Yes   Stair Management: One rail Right;Step to pattern;Forwards;With crutches;Two rails Number of Stairs: 2(x3) General stair comments: Did stairs once with both rails, once with husband under left arm and rail, and once with crutch and rail.  Pt anxious on steps, but looked best with both rails. Min assist for most techniques, likely mod assist with rail and husband under her arm.       Balance Overall balance assessment: Needs assistance Sitting-balance support: Feet supported Sitting balance-Leahy Scale: Good     Standing balance support: Bilateral upper extremity supported;No upper extremity supported;Single extremity supported Standing balance-Leahy Scale: Fair                              Cognition Arousal/Alertness: Awake/alert Behavior During Therapy: WFL for tasks assessed/performed Overall Cognitive Status: Within Functional Limits for tasks assessed                                               Pertinent Vitals/Pain Pain Assessment: 0-10 Pain Score: 7  Pain Location: RLE Pain Descriptors / Indicators: Aching;Sore;Throbbing;Guarding Pain Intervention(s): Limited activity within patient's tolerance;Monitored during session;Repositioned;Patient requesting pain meds-RN notified;RN gave pain meds during session           PT Goals (current goals can now be  found in the care plan section) Acute Rehab PT Goals Patient Stated Goal: get back to work Progress towards PT goals: Progressing toward goals    Frequency    Min 5X/week      PT Plan Current plan remains appropriate       AM-PAC PT "6 Clicks" Daily Activity  Outcome Measure  Difficulty turning over in bed (including adjusting bedclothes, sheets and blankets)?: A Little Difficulty moving from lying on back to  sitting on the side of the bed? : A Little Difficulty sitting down on and standing up from a chair with arms (e.g., wheelchair, bedside commode, etc,.)?: None Help needed moving to and from a bed to chair (including a wheelchair)?: None Help needed walking in hospital room?: None Help needed climbing 3-5 steps with a railing? : A Lot 6 Click Score: 20    End of Session   Activity Tolerance: Patient tolerated treatment well Patient left: in chair;with call bell/phone within reach;with family/visitor present Nurse Communication: Mobility status PT Visit Diagnosis: Difficulty in walking, not elsewhere classified (R26.2);Pain Pain - Right/Left: Right Pain - part of body: Leg     Time: 1610-9604 PT Time Calculation (min) (ACUTE ONLY): 34 min  Charges:  $Gait Training: 23-37 mins          Havah Ammon B. Madolyn Ackroyd, PT, DPT [phone]             08/22/2017, 11:28 AM

## 2017-08-22 NOTE — Progress Notes (Signed)
Orthopedic Tech Progress Note Patient Details:  Erika SettleJennifer A Hart 31-May-1976 [id number]  Ortho Devices Type of Ortho Device: CAM walker Ortho Device/Splint Location: rle Ortho Device/Splint Interventions: Application   Post Interventions Patient Tolerated: Well Instructions Provided: Care of device   Nikki DomCrawford, [name] 08/22/2017, 9:59 AM

## 2017-08-22 NOTE — Plan of Care (Signed)
  Pain Managment: General experience of comfort will improve 08/22/2017 1008 - Progressing by Darrow BussingArcilla, Jameyah Fennewald M, RN   Safety: Ability to remain free from injury will improve 08/22/2017 1008 - Progressing by Darrow BussingArcilla, Pax Reasoner M, RN

## 2017-08-22 NOTE — Discharge Instructions (Signed)
Orthopaedic Trauma Service Discharge Instructions   General Discharge Instructions  WEIGHT BEARING STATUS:Non weight bearing right lower extremity  RANGE OF MOTION/ACTIVITY: Come out of boot about three times a day to work on ankle range of motion  Wound Care: As noted below  DVT/PE prophylaxis: Full dose aspirin once a day for 30 days  Diet: as you were eating previously.  Can use over the counter stool softeners and bowel preparations, such as Miralax, to help with bowel movements.  Narcotics can be constipating.  Be sure to drink plenty of fluids  PAIN MEDICATION USE AND EXPECTATIONS  You have likely been given narcotic medications to help control your pain.  After a traumatic event that results in an fracture (broken bone) with or without surgery, it is ok to use narcotic pain medications to help control one's pain.  We understand that everyone responds to pain differently and each individual patient will be evaluated on a regular basis for the continued need for narcotic medications. Ideally, narcotic medication use should last no more than 6-8 weeks (coinciding with fracture healing).   As a patient it is your responsibility as well to monitor narcotic medication use and report the amount and frequency you use these medications when you come to your office visit.   We would also advise that if you are using narcotic medications, you should take a dose prior to therapy to maximize you participation.  IF YOU ARE ON NARCOTIC MEDICATIONS IT IS NOT PERMISSIBLE TO OPERATE A MOTOR VEHICLE (MOTORCYCLE/CAR/TRUCK/MOPED) OR HEAVY MACHINERY DO NOT MIX NARCOTICS WITH OTHER CNS (CENTRAL NERVOUS SYSTEM) DEPRESSANTS SUCH AS ALCOHOL   STOP SMOKING OR USING NICOTINE PRODUCTS!!!!  As discussed nicotine severely impairs your body's ability to heal surgical and traumatic wounds but also impairs bone healing.  Wounds and bone heal by forming microscopic blood vessels (angiogenesis) and nicotine is a  vasoconstrictor (essentially, shrinks blood vessels).  Therefore, if vasoconstriction occurs to these microscopic blood vessels they essentially disappear and are unable to deliver necessary nutrients to the healing tissue.  This is one modifiable factor that you can do to dramatically increase your chances of healing your injury.    (This means no smoking, no nicotine gum, patches, etc)  DO NOT USE NONSTEROIDAL ANTI-INFLAMMATORY DRUGS (NSAID'S)  Using products such as Advil (ibuprofen), Aleve (naproxen), Motrin (ibuprofen) for additional pain control during fracture healing can delay and/or prevent the healing response.  If you would like to take over the counter (OTC) medication, Tylenol (acetaminophen) is ok.  However, some narcotic medications that are given for pain control contain acetaminophen as well. Therefore, you should not exceed more than 4000 mg of tylenol in a day if you do not have liver disease.  Also note that there are may OTC medicines, such as cold medicines and allergy medicines that my contain tylenol as well.  If you have any questions about medications and/or interactions please ask your doctor/PA or your pharmacist.      ICE AND ELEVATE INJURED/OPERATIVE EXTREMITY  Using ice and elevating the injured extremity above your heart can help with swelling and pain control.  Icing in a pulsatile fashion, such as 20 minutes on and 20 minutes off, can be followed.    Do not place ice directly on skin. Make sure there is a barrier between to skin and the ice pack.    Using frozen items such as frozen peas works well as the conform nicely to the are that needs to be iced.  USE AN ACE WRAP OR TED HOSE FOR SWELLING CONTROL  In addition to icing and elevation, Ace wraps or TED hose are used to help limit and resolve swelling.  It is recommended to use Ace wraps or TED hose until you are informed to stop.    When using Ace Wraps start the wrapping distally (farthest away from the body) and  wrap proximally (closer to the body)   Example: If you had surgery on your leg or thing and you do not have a splint on, start the ace wrap at the toes and work your way up to the thigh        If you had surgery on your upper extremity and do not have a splint on, start the ace wrap at your fingers and work your way up to the upper arm  IF YOU ARE IN A CAM BOOT (BLACK BOOT)  You may remove boot periodically. Perform daily dressing changes as noted below.  Wash the liner of the boot regularly and wear a sock when wearing the boot. It is recommended that you sleep in the boot until told otherwise  CALL THE OFFICE WITH ANY QUESTIONS OR CONCERNS: [phone]  Discharge Wound Care Instructions  Do NOT apply any ointments, solutions or lotions to pin sites or surgical wounds.  These prevent needed drainage and even though solutions like hydrogen peroxide kill bacteria, they also damage cells lining the pin sites that help fight infection.  Applying lotions or ointments can keep the wounds moist and can cause them to breakdown and open up as well. This can increase the risk for infection. When in doubt call the office.  Change the dressing on Sunday  Surgical incisions should be dressed daily.  If any drainage is noted, use one layer of adaptic, then gauze, Kerlix, and an ace wrap.  Once the incision is completely dry and without drainage, it may be left open to air out.  Showering may begin after that.  Cleaning gently with soap and water. Keep a dressing/ACE wrap over the suture line to prevent the boot from rubbing against the incisions

## 2017-08-28 NOTE — Anesthesia Postprocedure Evaluation (Signed)
Anesthesia Post Note  Patient: Erika SettleJennifer A Barsanti  Procedure(s) Performed: IRRIGATION AND DEBRIDEMENT EXTREMITY (Right Ankle) OPEN REDUCTION INTERNAL FIXATION (ORIF) TIBIA/FIBULA FRACTURE (Right Ankle) APPLICATION OF WOUND VAC (Right Ankle)     Patient location during evaluation: PACU Anesthesia Type: General Level of consciousness: awake and alert Pain management: pain level controlled Vital Signs Assessment: post-procedure vital signs reviewed and stable Respiratory status: spontaneous breathing, nonlabored ventilation, respiratory function stable and patient connected to nasal cannula oxygen Cardiovascular status: blood pressure returned to baseline and stable Postop Assessment: no apparent nausea or vomiting Anesthetic complications: no    Last Vitals:  Vitals:   08/21/17 2100 08/22/17 0641  BP: (!) 94/57 115/70  Pulse: 87 77  Resp: 16 16  Temp: 37.2 C 37.1 C  SpO2: 96% 99%    Last Pain:  Vitals:   08/22/17 1036  TempSrc:   PainSc: 5                  Julie-Anne Torain S

## 2017-09-02 DIAGNOSIS — S82871E Displaced pilon fracture of right tibia, subsequent encounter for open fracture type I or II with routine healing: Secondary | ICD-10-CM | POA: Diagnosis not present

## 2017-09-30 DIAGNOSIS — S82871E Displaced pilon fracture of right tibia, subsequent encounter for open fracture type I or II with routine healing: Secondary | ICD-10-CM | POA: Diagnosis not present

## 2017-10-01 ENCOUNTER — Encounter: Payer: Self-pay | Admitting: Physician Assistant

## 2017-10-01 DIAGNOSIS — M25571 Pain in right ankle and joints of right foot: Secondary | ICD-10-CM | POA: Diagnosis not present

## 2017-10-01 DIAGNOSIS — Z713 Dietary counseling and surveillance: Secondary | ICD-10-CM

## 2017-10-01 DIAGNOSIS — R262 Difficulty in walking, not elsewhere classified: Secondary | ICD-10-CM | POA: Diagnosis not present

## 2017-10-01 DIAGNOSIS — Z6833 Body mass index (BMI) 33.0-33.9, adult: Secondary | ICD-10-CM

## 2017-10-01 DIAGNOSIS — E6609 Other obesity due to excess calories: Secondary | ICD-10-CM

## 2017-10-01 DIAGNOSIS — M25671 Stiffness of right ankle, not elsewhere classified: Secondary | ICD-10-CM | POA: Diagnosis not present

## 2017-10-01 MED ORDER — PHENTERMINE HCL 37.5 MG PO TABS: 37.5000 mg | ORAL_TABLET | Freq: Every day | ORAL | 0 refills | Status: DC

## 2017-10-02 ENCOUNTER — Telehealth: Payer: Self-pay

## 2017-10-02 NOTE — Telephone Encounter (Signed)
LM for pharmacy-Prescription for Phentermine called in to [facility] in [employer].  Thanks,  -Danel Requena

## 2017-10-03 DIAGNOSIS — R262 Difficulty in walking, not elsewhere classified: Secondary | ICD-10-CM | POA: Diagnosis not present

## 2017-10-03 DIAGNOSIS — M25671 Stiffness of right ankle, not elsewhere classified: Secondary | ICD-10-CM | POA: Diagnosis not present

## 2017-10-03 DIAGNOSIS — M25571 Pain in right ankle and joints of right foot: Secondary | ICD-10-CM | POA: Diagnosis not present

## 2017-10-06 DIAGNOSIS — M25671 Stiffness of right ankle, not elsewhere classified: Secondary | ICD-10-CM | POA: Diagnosis not present

## 2017-10-06 DIAGNOSIS — M25571 Pain in right ankle and joints of right foot: Secondary | ICD-10-CM | POA: Diagnosis not present

## 2017-10-06 DIAGNOSIS — R262 Difficulty in walking, not elsewhere classified: Secondary | ICD-10-CM | POA: Diagnosis not present

## 2017-10-08 DIAGNOSIS — R262 Difficulty in walking, not elsewhere classified: Secondary | ICD-10-CM | POA: Diagnosis not present

## 2017-10-08 DIAGNOSIS — M25571 Pain in right ankle and joints of right foot: Secondary | ICD-10-CM | POA: Diagnosis not present

## 2017-10-08 DIAGNOSIS — M25671 Stiffness of right ankle, not elsewhere classified: Secondary | ICD-10-CM | POA: Diagnosis not present

## 2017-10-13 DIAGNOSIS — R262 Difficulty in walking, not elsewhere classified: Secondary | ICD-10-CM | POA: Diagnosis not present

## 2017-10-13 DIAGNOSIS — M25671 Stiffness of right ankle, not elsewhere classified: Secondary | ICD-10-CM | POA: Diagnosis not present

## 2017-10-13 DIAGNOSIS — M25571 Pain in right ankle and joints of right foot: Secondary | ICD-10-CM | POA: Diagnosis not present

## 2017-10-14 ENCOUNTER — Other Ambulatory Visit: Payer: Self-pay | Admitting: Physician Assistant

## 2017-10-14 DIAGNOSIS — E039 Hypothyroidism, unspecified: Secondary | ICD-10-CM

## 2017-10-17 DIAGNOSIS — M25671 Stiffness of right ankle, not elsewhere classified: Secondary | ICD-10-CM | POA: Diagnosis not present

## 2017-10-17 DIAGNOSIS — R262 Difficulty in walking, not elsewhere classified: Secondary | ICD-10-CM | POA: Diagnosis not present

## 2017-10-17 DIAGNOSIS — M25571 Pain in right ankle and joints of right foot: Secondary | ICD-10-CM | POA: Diagnosis not present

## 2017-10-20 DIAGNOSIS — M545 Low back pain: Secondary | ICD-10-CM | POA: Diagnosis not present

## 2017-10-20 DIAGNOSIS — M9904 Segmental and somatic dysfunction of sacral region: Secondary | ICD-10-CM | POA: Diagnosis not present

## 2017-10-20 DIAGNOSIS — M9903 Segmental and somatic dysfunction of lumbar region: Secondary | ICD-10-CM | POA: Diagnosis not present

## 2017-10-21 DIAGNOSIS — M545 Low back pain: Secondary | ICD-10-CM | POA: Diagnosis not present

## 2017-10-21 DIAGNOSIS — M9904 Segmental and somatic dysfunction of sacral region: Secondary | ICD-10-CM | POA: Diagnosis not present

## 2017-10-21 DIAGNOSIS — M9903 Segmental and somatic dysfunction of lumbar region: Secondary | ICD-10-CM | POA: Diagnosis not present

## 2017-10-23 DIAGNOSIS — R262 Difficulty in walking, not elsewhere classified: Secondary | ICD-10-CM | POA: Diagnosis not present

## 2017-10-23 DIAGNOSIS — M9904 Segmental and somatic dysfunction of sacral region: Secondary | ICD-10-CM | POA: Diagnosis not present

## 2017-10-23 DIAGNOSIS — M25671 Stiffness of right ankle, not elsewhere classified: Secondary | ICD-10-CM | POA: Diagnosis not present

## 2017-10-23 DIAGNOSIS — M545 Low back pain: Secondary | ICD-10-CM | POA: Diagnosis not present

## 2017-10-23 DIAGNOSIS — M9903 Segmental and somatic dysfunction of lumbar region: Secondary | ICD-10-CM | POA: Diagnosis not present

## 2017-10-23 DIAGNOSIS — M25571 Pain in right ankle and joints of right foot: Secondary | ICD-10-CM | POA: Diagnosis not present

## 2017-10-29 DIAGNOSIS — M25671 Stiffness of right ankle, not elsewhere classified: Secondary | ICD-10-CM | POA: Diagnosis not present

## 2017-10-29 DIAGNOSIS — R262 Difficulty in walking, not elsewhere classified: Secondary | ICD-10-CM | POA: Diagnosis not present

## 2017-10-29 DIAGNOSIS — M25571 Pain in right ankle and joints of right foot: Secondary | ICD-10-CM | POA: Diagnosis not present

## 2017-10-30 DIAGNOSIS — M9904 Segmental and somatic dysfunction of sacral region: Secondary | ICD-10-CM | POA: Diagnosis not present

## 2017-10-30 DIAGNOSIS — M545 Low back pain: Secondary | ICD-10-CM | POA: Diagnosis not present

## 2017-10-30 DIAGNOSIS — M9903 Segmental and somatic dysfunction of lumbar region: Secondary | ICD-10-CM | POA: Diagnosis not present

## 2017-11-07 DIAGNOSIS — M25571 Pain in right ankle and joints of right foot: Secondary | ICD-10-CM | POA: Diagnosis not present

## 2017-11-07 DIAGNOSIS — M25671 Stiffness of right ankle, not elsewhere classified: Secondary | ICD-10-CM | POA: Diagnosis not present

## 2017-11-07 DIAGNOSIS — R262 Difficulty in walking, not elsewhere classified: Secondary | ICD-10-CM | POA: Diagnosis not present

## 2017-11-11 DIAGNOSIS — R262 Difficulty in walking, not elsewhere classified: Secondary | ICD-10-CM | POA: Diagnosis not present

## 2017-11-11 DIAGNOSIS — M25571 Pain in right ankle and joints of right foot: Secondary | ICD-10-CM | POA: Diagnosis not present

## 2017-11-11 DIAGNOSIS — M25671 Stiffness of right ankle, not elsewhere classified: Secondary | ICD-10-CM | POA: Diagnosis not present

## 2017-11-17 DIAGNOSIS — S82871E Displaced pilon fracture of right tibia, subsequent encounter for open fracture type I or II with routine healing: Secondary | ICD-10-CM | POA: Diagnosis not present

## 2017-11-20 DIAGNOSIS — R262 Difficulty in walking, not elsewhere classified: Secondary | ICD-10-CM | POA: Diagnosis not present

## 2017-11-20 DIAGNOSIS — M25571 Pain in right ankle and joints of right foot: Secondary | ICD-10-CM | POA: Diagnosis not present

## 2017-11-20 DIAGNOSIS — M25671 Stiffness of right ankle, not elsewhere classified: Secondary | ICD-10-CM | POA: Diagnosis not present

## 2017-11-26 DIAGNOSIS — M25571 Pain in right ankle and joints of right foot: Secondary | ICD-10-CM | POA: Diagnosis not present

## 2017-11-26 DIAGNOSIS — R262 Difficulty in walking, not elsewhere classified: Secondary | ICD-10-CM | POA: Diagnosis not present

## 2017-11-26 DIAGNOSIS — M25671 Stiffness of right ankle, not elsewhere classified: Secondary | ICD-10-CM | POA: Diagnosis not present

## 2017-12-02 DIAGNOSIS — M25571 Pain in right ankle and joints of right foot: Secondary | ICD-10-CM | POA: Diagnosis not present

## 2017-12-02 DIAGNOSIS — R262 Difficulty in walking, not elsewhere classified: Secondary | ICD-10-CM | POA: Diagnosis not present

## 2017-12-02 DIAGNOSIS — M25671 Stiffness of right ankle, not elsewhere classified: Secondary | ICD-10-CM | POA: Diagnosis not present

## 2017-12-04 DIAGNOSIS — R262 Difficulty in walking, not elsewhere classified: Secondary | ICD-10-CM | POA: Diagnosis not present

## 2017-12-04 DIAGNOSIS — M25571 Pain in right ankle and joints of right foot: Secondary | ICD-10-CM | POA: Diagnosis not present

## 2017-12-04 DIAGNOSIS — M25671 Stiffness of right ankle, not elsewhere classified: Secondary | ICD-10-CM | POA: Diagnosis not present

## 2017-12-09 DIAGNOSIS — M25671 Stiffness of right ankle, not elsewhere classified: Secondary | ICD-10-CM | POA: Diagnosis not present

## 2017-12-09 DIAGNOSIS — M25571 Pain in right ankle and joints of right foot: Secondary | ICD-10-CM | POA: Diagnosis not present

## 2017-12-09 DIAGNOSIS — R262 Difficulty in walking, not elsewhere classified: Secondary | ICD-10-CM | POA: Diagnosis not present

## 2018-01-27 ENCOUNTER — Ambulatory Visit: Payer: 59 | Admitting: Family Medicine

## 2018-01-27 ENCOUNTER — Ambulatory Visit: Payer: 59 | Admitting: Physician Assistant

## 2018-01-27 ENCOUNTER — Encounter: Payer: Self-pay | Admitting: Family Medicine

## 2018-01-27 VITALS — BP 110/82 | HR 90 | Temp 98.0°F | Wt 185.0 lb

## 2018-01-27 DIAGNOSIS — I889 Nonspecific lymphadenitis, unspecified: Secondary | ICD-10-CM

## 2018-01-27 MED ORDER — AMOXICILLIN 875 MG PO TABS
875.0000 mg | ORAL_TABLET | Freq: Two times a day (BID) | ORAL | 0 refills | Status: DC
Start: 2018-01-27 — End: 2018-02-27

## 2018-01-27 NOTE — Progress Notes (Signed)
Patient: Erika Hart Female    DOB: [birth year]   42 y.o.   MRN: [id number] Visit Date: 01/27/2018  Today's Provider: Dortha Kern, PA    Chief Complaint  Patient presents with  . Neck lump   Subjective:    HPI Patient presents today C/O neck lump on left side. She states the area is tender to the touch. She noticed the lump a couple months. She is unsure if the lump has increased in size. Patient denies ear pain. She reports her neck sometimes feels like a cramp in it and scratchy throat.     Past Medical History:  Diagnosis Date  . Allergy   . Arthritis   . GERD (gastroesophageal reflux disease)   . Hypertension   . Thyroid disease    Past Surgical History:  Procedure Laterality Date  . APPLICATION OF WOUND VAC Right 08/20/2017   Procedure: APPLICATION OF WOUND VAC;  Surgeon: Roby Lofts, MD;  Location: [facility];  Service: Orthopedics;  Laterality: Right;  . CESAREAN SECTION  2004  . I&D EXTREMITY Right 08/20/2017   Procedure: IRRIGATION AND DEBRIDEMENT EXTREMITY;  Surgeon: Roby Lofts, MD;  Location: [facility] OR;  Service: Orthopedics;  Laterality: Right;  . OPEN REDUCTION INTERNAL FIXATION (ORIF) TIBIA/FIBULA FRACTURE Right 08/20/2017   Procedure: OPEN REDUCTION INTERNAL FIXATION (ORIF) TIBIA/FIBULA FRACTURE;  Surgeon: Roby Lofts, MD;  Location: [facility];  Service: Orthopedics;  Laterality: Right;  . ORIF ANKLE FRACTURE Right 1/232019   WOUND VAC APPLICATION   Family History  Problem Relation Age of Onset  . Thyroid disease Mother   . Diabetes Father   . Stroke Father   . Healthy Daughter   . Breast cancer Neg Hx    Allergies  Allergen Reactions  . Sulfa Antibiotics Rash    Current Outpatient Medications:  .  acetaminophen (TYLENOL) 500 MG tablet, Take 500 mg by mouth every 6 (six) hours as needed for headache., Disp: , Rfl:  .  Cholecalciferol (VITAMIN D PO), Take 1 tablet by mouth daily., Disp: , Rfl:  .  hydrochlorothiazide (MICROZIDE) 12.5 MG  capsule, Take 1 capsule (12.5 mg total) by mouth daily., Disp: 90 capsule, Rfl: 1 .  levothyroxine (SYNTHROID, LEVOTHROID) 112 MCG tablet, TAKE 1 TABLET BY MOUTH EVERY DAY, Disp: 90 tablet, Rfl: 1 .  methocarbamol (ROBAXIN) 500 MG tablet, Take 1-2 tablets (500-1,000 mg total) by mouth every 6 (six) hours as needed for muscle spasms., Disp: 40 tablet, Rfl: 0 .  Aspirin-Acetaminophen-Caffeine (EXCEDRIN MIGRAINE PO), Take by mouth., Disp: , Rfl:  .  HYDROcodone-acetaminophen (NORCO) 7.5-325 MG tablet, Take 1-2 tablets by mouth every 6 (six) hours as needed for moderate pain or severe pain. (Patient not taking: Reported on 01/27/2018), Disp: 45 tablet, Rfl: 0 .  phentermine (ADIPEX-P) 37.5 MG tablet, Take 1 tablet (37.5 mg total) by mouth daily before breakfast. (Patient not taking: Reported on 01/27/2018), Disp: 30 tablet, Rfl: 0  Review of Systems  Constitutional: Negative.   Respiratory: Negative.   Cardiovascular: Negative.   Musculoskeletal: Positive for neck pain (neck lump).   Social History   Tobacco Use  . Smoking status: Never Smoker  . Smokeless tobacco: Never Used  Substance Use Topics  . Alcohol use: Yes    Comment: 1-2 a month   Objective:   BP 110/82 (BP Location: Right Arm, Patient Position: Sitting, Cuff Size: Normal)   Pulse 90   Temp 98 F (36.7 C) (Oral)   Wt 185  lb (83.9 kg)   SpO2 98%   BMI 33.84 kg/m   Physical Exam  Constitutional: She is oriented to person, place, and time. She appears well-developed and well-nourished. No distress.  HENT:  Head: Normocephalic and atraumatic.  Right Ear: Hearing normal.  Left Ear: Hearing normal.  Nose: Nose normal.  Eyes: Conjunctivae and lids are normal. Right eye exhibits no discharge. Left eye exhibits no discharge. No scleral icterus.  Neck: Normal range of motion.  Slightly enlarged and tender left posterior cervical lymph node.  Cardiovascular: Normal rate.  Pulmonary/Chest: Effort normal and breath sounds normal. No  respiratory distress.  Musculoskeletal: Normal range of motion.  Lymphadenopathy:    She has cervical adenopathy.  Neurological: She is alert and oriented to person, place, and time.  Skin: Skin is intact. No lesion and no rash noted.  Psychiatric: She has a normal mood and affect. Her speech is normal and behavior is normal. Thought content normal.      Assessment & Plan:     1. Cervical lymphadenitis Has felt a lump/enlarged posterior cervical lymph node over the past month. Some soreness now but not red or any sign of scalp or URI infection today. Node is freely moveable and soft. Treat with antibiotic and get CBC with diff. No sign of thyroid nodule today. Normal cervical ROM. Follow up pending lab report. - amoxicillin (AMOXIL) 875 MG tablet; Take 1 tablet (875 mg total) by mouth 2 (two) times daily.  Dispense: 20 tablet; Refill: 0 - CBC with Differential/Platelet       Dortha Kernennis Chrismon, PA  [facility]

## 2018-01-28 LAB — CBC WITH DIFFERENTIAL/PLATELET
BASOS ABS: 0.1 10*3/uL (ref 0.0–0.2)
Basos: 1 %
EOS (ABSOLUTE): 0.2 10*3/uL (ref 0.0–0.4)
Eos: 4 %
HEMOGLOBIN: 14.5 g/dL (ref 11.1–15.9)
Hematocrit: 43.6 % (ref 34.0–46.6)
IMMATURE GRANULOCYTES: 0 %
Immature Grans (Abs): 0 10*3/uL (ref 0.0–0.1)
LYMPHS ABS: 2.1 10*3/uL (ref 0.7–3.1)
Lymphs: 36 %
MCH: 29.8 pg (ref 26.6–33.0)
MCHC: 33.3 g/dL (ref 31.5–35.7)
MCV: 90 fL (ref 79–97)
Monocytes Absolute: 0.7 10*3/uL (ref 0.1–0.9)
Monocytes: 12 %
Neutrophils Absolute: 2.8 10*3/uL (ref 1.4–7.0)
Neutrophils: 47 %
Platelets: 274 10*3/uL (ref 150–450)
RBC: 4.87 x10E6/uL (ref 3.77–5.28)
RDW: 13.8 % (ref 12.3–15.4)
WBC: 5.8 10*3/uL (ref 3.4–10.8)

## 2018-01-30 ENCOUNTER — Telehealth: Payer: Self-pay

## 2018-01-30 NOTE — Telephone Encounter (Signed)
LMTCB, Results and result note released to my chart.

## 2018-01-30 NOTE — Telephone Encounter (Signed)
Pt advised, and agrees with plan. 

## 2018-01-30 NOTE — Telephone Encounter (Signed)
-----   Message from Tamsen Roersennis E Chrismon, [place] sent at 01/30/2018  8:43 AM EDT ----- No sign of infection in blood stream. The antibiotic given should help. If no better after antibiotics finished, will need recheck appointment.

## 2018-02-13 ENCOUNTER — Ambulatory Visit: Payer: 59 | Admitting: Physician Assistant

## 2018-02-15 ENCOUNTER — Other Ambulatory Visit: Payer: Self-pay | Admitting: Physician Assistant

## 2018-02-15 DIAGNOSIS — I10 Essential (primary) hypertension: Secondary | ICD-10-CM

## 2018-02-27 ENCOUNTER — Encounter: Payer: Self-pay | Admitting: Physician Assistant

## 2018-02-27 ENCOUNTER — Ambulatory Visit (INDEPENDENT_AMBULATORY_CARE_PROVIDER_SITE_OTHER): Payer: 59 | Admitting: Physician Assistant

## 2018-02-27 ENCOUNTER — Other Ambulatory Visit (HOSPITAL_COMMUNITY)
Admission: RE | Admit: 2018-02-27 | Discharge: 2018-02-27 | Disposition: A | Discharge status: Home / Self Care | Payer: 59 | Source: Ambulatory Visit | Attending: Physician Assistant | Admitting: Physician Assistant

## 2018-02-27 VITALS — BP 110/80 | HR 100 | Temp 98.0°F | Resp 16 | Ht 62.0 in | Wt 189.0 lb

## 2018-02-27 DIAGNOSIS — Z1231 Encounter for screening mammogram for malignant neoplasm of breast: Secondary | ICD-10-CM

## 2018-02-27 DIAGNOSIS — Z124 Encounter for screening for malignant neoplasm of cervix: Secondary | ICD-10-CM | POA: Diagnosis not present

## 2018-02-27 DIAGNOSIS — Z6834 Body mass index (BMI) 34.0-34.9, adult: Secondary | ICD-10-CM

## 2018-02-27 DIAGNOSIS — E6609 Other obesity due to excess calories: Secondary | ICD-10-CM

## 2018-02-27 DIAGNOSIS — I10 Essential (primary) hypertension: Secondary | ICD-10-CM | POA: Diagnosis not present

## 2018-02-27 DIAGNOSIS — Z13220 Encounter for screening for lipoid disorders: Secondary | ICD-10-CM

## 2018-02-27 DIAGNOSIS — R350 Frequency of micturition: Secondary | ICD-10-CM

## 2018-02-27 DIAGNOSIS — Z1239 Encounter for other screening for malignant neoplasm of breast: Secondary | ICD-10-CM

## 2018-02-27 DIAGNOSIS — Z833 Family history of diabetes mellitus: Secondary | ICD-10-CM

## 2018-02-27 DIAGNOSIS — Z713 Dietary counseling and surveillance: Secondary | ICD-10-CM

## 2018-02-27 DIAGNOSIS — Z136 Encounter for screening for cardiovascular disorders: Secondary | ICD-10-CM

## 2018-02-27 DIAGNOSIS — E039 Hypothyroidism, unspecified: Secondary | ICD-10-CM

## 2018-02-27 DIAGNOSIS — Z0000 Encounter for general adult medical examination without abnormal findings: Secondary | ICD-10-CM

## 2018-02-27 DIAGNOSIS — R631 Polydipsia: Secondary | ICD-10-CM

## 2018-02-27 MED ORDER — PHENTERMINE HCL 37.5 MG PO TABS: 37.5000 mg | ORAL_TABLET | Freq: Every day | ORAL | 2 refills | Status: DC

## 2018-02-27 MED ORDER — HYDROCHLOROTHIAZIDE 12.5 MG PO CAPS: ORAL_CAPSULE | ORAL | 1 refills | Status: DC

## 2018-02-27 NOTE — Progress Notes (Signed)
Patient: Erika Hart, Female    DOB: 17-Dec-1975, 42 y.o.   MRN: [id number] Visit Date: 02/27/2018  Today's Provider: Margaretann LovelessJennifer M Danton Palmateer, PA-C   Chief Complaint  Patient presents with  . Annual Exam   Subjective:    Annual physical exam Erika Hart is a 42 y.o. female who presents today for health maintenance and complete physical. She feels fairly well. She reports exercising none. She reports she is sleeping fairly well.  Last CPE:09/06/16 Mammogram:10/25/16 BI-RADS 1 Pap:12/09/14 -----------------------------------------------------------------   Review of Systems  Constitutional: Positive for diaphoresis and fatigue.  HENT: Negative.   Eyes: Negative.   Respiratory: Negative.   Cardiovascular: Negative.   Gastrointestinal: Positive for abdominal distention.  Endocrine: Positive for cold intolerance, polydipsia and polyuria.  Genitourinary: Negative.   Musculoskeletal: Positive for arthralgias, neck pain and neck stiffness.  Skin: Negative.   Allergic/Immunologic: Negative.   Neurological: Positive for dizziness and headaches.  Hematological: Positive for adenopathy.  Psychiatric/Behavioral: Negative.     Social History      She  reports that she has never smoked. She has never used smokeless tobacco. She reports that she drinks alcohol. She reports that she does not use drugs.       Social History   Socioeconomic History  . Marital status: Married    Spouse name: Not on file  . Number of children: Not on file  . Years of education: Not on file  . Highest education level: Not on file  Occupational History  . Not on file  Social Needs  . Financial resource strain: Not on file  . Food insecurity:    Worry: Not on file    Inability: Not on file  . Transportation needs:    Medical: Not on file    Non-medical: Not on file  Tobacco Use  . Smoking status: Never Smoker  . Smokeless tobacco: Never Used  Substance and Sexual Activity  .  Alcohol use: Yes    Comment: 1-2 a month  . Drug use: No  . Sexual activity: Not on file  Lifestyle  . Physical activity:    Days per week: Not on file    Minutes per session: Not on file  . Stress: Not on file  Relationships  . Social connections:    Talks on phone: Not on file    Gets together: Not on file    Attends religious service: Not on file    Active member of club or organization: Not on file    Attends meetings of clubs or organizations: Not on file    Relationship status: Not on file  Other Topics Concern  . Not on file  Social History Narrative  . Not on file    Past Medical History:  Diagnosis Date  . Allergy   . Arthritis   . GERD (gastroesophageal reflux disease)   . Hypertension   . Thyroid disease      Patient Active Problem List   Diagnosis Date Noted  . Pilon fracture of right tibia, open type I or II, initial encounter 08/20/2017  . Bulge of lumbar disc without myelopathy 10/20/2015  . BP (high blood pressure) 10/20/2015  . Acquired hypothyroidism 10/20/2015  . Headache, migraine 10/20/2015  . Adult BMI 30+ 10/20/2015  . Esophageal reflux 10/20/2015    Past Surgical History:  Procedure Laterality Date  . APPLICATION OF WOUND VAC Right 08/20/2017   Procedure: APPLICATION OF WOUND VAC;  Surgeon: Roby LoftsHaddix, Kevin P,  MD;  Location: [facility];  Service: Orthopedics;  Laterality: Right;  . CESAREAN SECTION  2004  . I&D EXTREMITY Right 08/20/2017   Procedure: IRRIGATION AND DEBRIDEMENT EXTREMITY;  Surgeon: Roby Lofts, MD;  Location: [facility] OR;  Service: Orthopedics;  Laterality: Right;  . OPEN REDUCTION INTERNAL FIXATION (ORIF) TIBIA/FIBULA FRACTURE Right 08/20/2017   Procedure: OPEN REDUCTION INTERNAL FIXATION (ORIF) TIBIA/FIBULA FRACTURE;  Surgeon: Roby Lofts, MD;  Location: [facility];  Service: Orthopedics;  Laterality: Right;  . ORIF ANKLE FRACTURE Right 1/232019   WOUND VAC APPLICATION    Family History        Family Status  Relation Name Status    . Mother  Alive  . Father  Deceased  . Brother  Alive  . Daughter  Alive  . Neg Hx  (Not Specified)        Her family history includes Diabetes in her father; Healthy in her daughter; Stroke in her father; Thyroid disease in her mother. There is no history of Breast cancer.      Allergies  Allergen Reactions  . Sulfa Antibiotics Rash     Current Outpatient Medications:  .  acetaminophen (TYLENOL) 500 MG tablet, Take 500 mg by mouth every 6 (six) hours as needed for headache., Disp: , Rfl:  .  CALCIUM CITRATE PO, Take by mouth., Disp: , Rfl:  .  Cholecalciferol (VITAMIN D PO), Take 1 tablet by mouth daily., Disp: , Rfl:  .  Cyanocobalamin (RA VITAMIN B-12 PO), Take by mouth., Disp: , Rfl:  .  hydrochlorothiazide (MICROZIDE) 12.5 MG capsule, TAKE 1 CAPSULE BY MOUTH EVERY DAY, Disp: 90 capsule, Rfl: 1 .  levothyroxine (SYNTHROID, LEVOTHROID) 112 MCG tablet, TAKE 1 TABLET BY MOUTH EVERY DAY, Disp: 90 tablet, Rfl: 1 .  methocarbamol (ROBAXIN) 500 MG tablet, Take 1-2 tablets (500-1,000 mg total) by mouth every 6 (six) hours as needed for muscle spasms., Disp: 40 tablet, Rfl: 0 .  Aspirin-Acetaminophen-Caffeine (EXCEDRIN MIGRAINE PO), Take by mouth., Disp: , Rfl:  .  phentermine (ADIPEX-P) 37.5 MG tablet, Take 1 tablet (37.5 mg total) by mouth daily before breakfast. (Patient not taking: Reported on 01/27/2018), Disp: 30 tablet, Rfl: 0   Patient Care Team: Margaretann Loveless, PA-C as PCP - General (Family Medicine)      Objective:   Vitals: BP 110/80 (BP Location: Left Arm, Patient Position: Sitting, Cuff Size: Normal)   Pulse 100   Temp 98 F (36.7 C) (Oral)   Resp 16   Ht 5\' 2"  (1.575 m)   Wt 189 lb (85.7 kg)   LMP 12/27/2017   SpO2 98%   BMI 34.57 kg/m    Vitals:   02/27/18 1451  BP: 110/80  Pulse: 100  Resp: 16  Temp: 98 F (36.7 C)  TempSrc: Oral  SpO2: 98%  Weight: 189 lb (85.7 kg)  Height: 5\' 2"  (1.575 m)     Physical Exam  Constitutional: She is oriented  to person, place, and time. She appears well-developed and well-nourished. No distress.  HENT:  Head: Normocephalic and atraumatic.  Right Ear: Hearing, tympanic membrane, external ear and ear canal normal.  Left Ear: Hearing, tympanic membrane, external ear and ear canal normal.  Nose: Nose normal.  Mouth/Throat: Uvula is midline, oropharynx is clear and moist and mucous membranes are normal. No oropharyngeal exudate.  Eyes: Pupils are equal, round, and reactive to light. Conjunctivae and EOM are normal. Right eye exhibits no discharge. Left eye exhibits no discharge. No scleral icterus.  Neck: Normal range of motion. Neck supple. No JVD present. Carotid bruit is not present. No tracheal deviation present. No thyromegaly present.  Cardiovascular: Normal rate, regular rhythm, normal heart sounds and intact distal pulses. Exam reveals no gallop and no friction rub.  No murmur heard. Pulmonary/Chest: Effort normal and breath sounds normal. No respiratory distress. She has no wheezes. She has no rales. She exhibits no tenderness. Right breast exhibits no inverted nipple, no mass, no nipple discharge, no skin change and no tenderness. Left breast exhibits no inverted nipple, no mass, no nipple discharge, no skin change and no tenderness. No breast tenderness, discharge or bleeding. Breasts are symmetrical.  Abdominal: Soft. Bowel sounds are normal. She exhibits no distension and no mass. There is no tenderness. There is no rebound and no guarding. Hernia confirmed negative in the right inguinal area and confirmed negative in the left inguinal area.  Genitourinary: Rectum normal, vagina normal and uterus normal. No breast tenderness, discharge or bleeding. Pelvic exam was performed with patient supine. There is no rash, tenderness, lesion or injury on the right labia. There is no rash, tenderness, lesion or injury on the left labia. Cervix exhibits no motion tenderness, no discharge and no friability. Right  adnexum displays no mass, no tenderness and no fullness. Left adnexum displays no mass, no tenderness and no fullness. No erythema, tenderness or bleeding in the vagina. No signs of injury around the vagina. No vaginal discharge found.  Musculoskeletal: Normal range of motion. She exhibits no edema or tenderness.  Lymphadenopathy:    She has no cervical adenopathy.       Right: No inguinal adenopathy present.       Left: No inguinal adenopathy present.  Neurological: She is alert and oriented to person, place, and time. She has normal reflexes. No cranial nerve deficit. Coordination normal.  Skin: Skin is warm and dry. No rash noted. She is not diaphoretic.  Psychiatric: She has a normal mood and affect. Her behavior is normal. Judgment and thought content normal.  Vitals reviewed.    Depression Screen PHQ 2/9 Scores 02/27/2018 09/06/2016  PHQ - 2 Score 1 0  PHQ- 9 Score 8 -      Assessment & Plan:     Routine Health Maintenance and Physical Exam  Exercise Activities and Dietary recommendations Goals    None      Immunization History  Administered Date(s) Administered  . Tdap 02/09/2008, 08/19/2017    Health Maintenance  Topic Date Due  . PAP SMEAR  12/08/2017  . INFLUENZA VACCINE  02/26/2018  . TETANUS/TDAP  08/20/2027  . HIV Screening  Completed     Discussed health benefits of physical activity, and encouraged her to engage in regular exercise appropriate for her age and condition.    1. Annual physical exam Normal physical exam today. Will check labs as below and f/u pending lab results. If labs are stable and WNL she will not need to have these rechecked for one year at her next annual physical exam. She is to call the office in the meantime if she has any acute issue, questions or concerns. - CBC with Differential/Platelet - Comprehensive metabolic panel  2. Screening for breast cancer Breast exam today was normal. There is no family history of breast cancer.  She does perform regular self breast exams. Mammogram was ordered as below. Information for [facility] was given to patient so she may schedule her mammogram at her convenience. - MM DIGITAL SCREENING BILATERAL; Future  3. Essential hypertension Stable. Diagnosis pulled for medication refill. Continue current medical treatment plan. - hydrochlorothiazide (MICROZIDE) 12.5 MG capsule; TAKE 1 CAPSULE BY MOUTH EVERY DAY  Dispense: 90 capsule; Refill: 1  4. Hypothyroidism, unspecified type Stable. Continue levothyroxine . Will check labs as below and f/u pending results. - TSH  5. Encounter for lipid screening for cardiovascular disease Will check labs as below and f/u pending results. - Lipid panel  6. Encounter for screening for cervical cancer  Pap collected today. Will send as below and f/u pending results. - Cytology - PAP  7. Increased frequency of urination Will check labs as below. Suspect OAB. Sample of Toviaz 4mg  given to patient today in the office to try. She is to call if this works well and she tolerates as I will send in a prescription if so.  - Hemoglobin A1c  8. Increased thirst Has positive family history of diabetes and having some changes noted recently with weight gain. Will check labs as below and f/u pending results. - Hemoglobin A1c  9. Family history of diabetes mellitus See above medical treatment plan. - Hemoglobin A1c  10. Encounter for weight loss counseling Restart Phentermine as below due to inactivity secondary from ankle fracture. Recently cleared from PT to start slowly increasing activity as tolerated. Will give phentermine as below to help with appetite control while she is restarting her weight loss journey.  - phentermine (ADIPEX-P) 37.5 MG tablet; Take 1 tablet (37.5 mg total) by mouth daily before breakfast.  Dispense: 30 tablet; Refill: 2  11. Class 1 obesity due to excess calories without serious comorbidity with body mass  index (BMI) of 34.0 to 34.9 in adult See above medical treatment plan. - phentermine (ADIPEX-P) 37.5 MG tablet; Take 1 tablet (37.5 mg total) by mouth daily before breakfast.  Dispense: 30 tablet; Refill: 2  --------------------------------------------------------------------    Margaretann Loveless, PA-C  [facility]

## 2018-02-27 NOTE — Patient Instructions (Signed)

## 2018-02-28 LAB — CBC WITH DIFFERENTIAL/PLATELET
BASOS ABS: 0 10*3/uL (ref 0.0–0.2)
BASOS: 0 %
EOS (ABSOLUTE): 0.2 10*3/uL (ref 0.0–0.4)
Eos: 3 %
HEMOGLOBIN: 15 g/dL (ref 11.1–15.9)
Hematocrit: 42.9 % (ref 34.0–46.6)
IMMATURE GRANS (ABS): 0 10*3/uL (ref 0.0–0.1)
IMMATURE GRANULOCYTES: 0 %
LYMPHS: 29 %
Lymphocytes Absolute: 2 10*3/uL (ref 0.7–3.1)
MCH: 30.7 pg (ref 26.6–33.0)
MCHC: 35 g/dL (ref 31.5–35.7)
MCV: 88 fL (ref 79–97)
MONOCYTES: 9 %
Monocytes Absolute: 0.6 10*3/uL (ref 0.1–0.9)
NEUTROS PCT: 59 %
Neutrophils Absolute: 4.1 10*3/uL (ref 1.4–7.0)
PLATELETS: 334 10*3/uL (ref 150–450)
RBC: 4.88 x10E6/uL (ref 3.77–5.28)
RDW: 13.4 % (ref 12.3–15.4)
WBC: 6.9 10*3/uL (ref 3.4–10.8)

## 2018-02-28 LAB — COMPREHENSIVE METABOLIC PANEL
ALBUMIN: 4.4 g/dL (ref 3.5–5.5)
ALT: 41 IU/L — AB (ref 0–32)
AST: 39 IU/L (ref 0–40)
Albumin/Globulin Ratio: 1.3 (ref 1.2–2.2)
Alkaline Phosphatase: 94 IU/L (ref 39–117)
BUN/Creatinine Ratio: 15 (ref 9–23)
BUN: 15 mg/dL (ref 6–24)
Bilirubin Total: 0.8 mg/dL (ref 0.0–1.2)
CALCIUM: 10 mg/dL (ref 8.7–10.2)
CO2: 25 mmol/L (ref 20–29)
CREATININE: 1.03 mg/dL — AB (ref 0.57–1.00)
Chloride: 99 mmol/L (ref 96–106)
GFR, EST AFRICAN AMERICAN: 77 mL/min/{1.73_m2} (ref >59)
GFR, EST NON AFRICAN AMERICAN: 67 mL/min/{1.73_m2} (ref >59)
GLUCOSE: 75 mg/dL (ref 65–99)
Globulin, Total: 3.3 g/dL (ref 1.5–4.5)
Potassium: 3.9 mmol/L (ref 3.5–5.2)
Sodium: 141 mmol/L (ref 134–144)
TOTAL PROTEIN: 7.7 g/dL (ref 6.0–8.5)

## 2018-02-28 LAB — LIPID PANEL
CHOLESTEROL TOTAL: 177 mg/dL (ref 100–199)
Chol/HDL Ratio: 2.9 ratio (ref 0.0–4.4)
HDL: 62 mg/dL (ref >39)
LDL Calculated: 92 mg/dL (ref 0–99)
Triglycerides: 113 mg/dL (ref 0–149)
VLDL Cholesterol Cal: 23 mg/dL (ref 5–40)

## 2018-02-28 LAB — HEMOGLOBIN A1C
ESTIMATED AVERAGE GLUCOSE: 105 mg/dL
Hgb A1c MFr Bld: 5.3 % (ref 4.8–5.6)

## 2018-02-28 LAB — TSH: TSH: 0.176 u[IU]/mL — ABNORMAL LOW (ref 0.450–4.500)

## 2018-03-02 ENCOUNTER — Telehealth: Payer: Self-pay

## 2018-03-02 NOTE — Telephone Encounter (Signed)
Patient advised as directed below.  Thanks,  -Kwynn Schlotter 

## 2018-03-02 NOTE — Telephone Encounter (Signed)
-----   Message from Margaretann LovelessJennifer M Burnette, [place]PA-C sent at 03/02/2018  9:04 AM EDT ----- Blood count is normal. Kidney function slightly decreased compared to baseline labs. This can be associated with dehydration, but since you have had to take more NSAIDs recently due to the ankle fracture I would like for you to push fluids and us recheck in 2-3 weeks. Also slight increase in one portion of liver enzymes, again questionable for increase in tylenol based products you have had to take since the ankle fracture. Try to limit fatty foods, alcohol and tylenol based products. A1c is normal. Cholesterol normal. Thyroid still slightly overcorrected. Would recommend to decrease to levothyroxine 100mcg from the 112mcg. We can also wait and monitor, your preference.

## 2018-03-09 LAB — CYTOLOGY - PAP
Diagnosis: NEGATIVE
HPV (WINDOPATH): NOT DETECTED

## 2018-03-10 ENCOUNTER — Telehealth: Payer: Self-pay

## 2018-03-10 NOTE — Telephone Encounter (Signed)
Patient advised as directed below.  Thanks,  -Joseline 

## 2018-03-10 NOTE — Telephone Encounter (Signed)
-----   Message from Margaretann LovelessJennifer M Burnette, [place]PA-C sent at 03/10/2018 12:52 PM EDT ----- Pap is normal, HPV negative.  Will repeat in 3-5 years.

## 2018-03-26 ENCOUNTER — Encounter: Payer: Self-pay | Admitting: Physician Assistant

## 2018-03-26 DIAGNOSIS — R7989 Other specified abnormal findings of blood chemistry: Secondary | ICD-10-CM

## 2018-03-26 DIAGNOSIS — R748 Abnormal levels of other serum enzymes: Secondary | ICD-10-CM

## 2018-04-30 ENCOUNTER — Encounter: Payer: Self-pay | Admitting: Physician Assistant

## 2018-05-01 DIAGNOSIS — R7989 Other specified abnormal findings of blood chemistry: Secondary | ICD-10-CM | POA: Diagnosis not present

## 2018-05-01 DIAGNOSIS — R748 Abnormal levels of other serum enzymes: Secondary | ICD-10-CM | POA: Diagnosis not present

## 2018-05-02 LAB — COMPREHENSIVE METABOLIC PANEL
A/G RATIO: 1.5 (ref 1.2–2.2)
ALBUMIN: 4.6 g/dL (ref 3.5–5.5)
ALT: 29 IU/L (ref 0–32)
AST: 31 IU/L (ref 0–40)
Alkaline Phosphatase: 71 IU/L (ref 39–117)
BUN / CREAT RATIO: 20 (ref 9–23)
BUN: 15 mg/dL (ref 6–24)
Bilirubin Total: 0.9 mg/dL (ref 0.0–1.2)
CALCIUM: 10 mg/dL (ref 8.7–10.2)
CO2: 21 mmol/L (ref 20–29)
Chloride: 99 mmol/L (ref 96–106)
Creatinine, Ser: 0.75 mg/dL (ref 0.57–1.00)
GFR, EST AFRICAN AMERICAN: 112 mL/min/{1.73_m2} (ref >59)
GFR, EST NON AFRICAN AMERICAN: 97 mL/min/{1.73_m2} (ref >59)
GLOBULIN, TOTAL: 3.1 g/dL (ref 1.5–4.5)
Glucose: 93 mg/dL (ref 65–99)
POTASSIUM: 4.2 mmol/L (ref 3.5–5.2)
Sodium: 139 mmol/L (ref 134–144)
TOTAL PROTEIN: 7.7 g/dL (ref 6.0–8.5)

## 2018-05-04 ENCOUNTER — Telehealth: Payer: Self-pay

## 2018-05-04 NOTE — Telephone Encounter (Signed)
LMTCB if she has any questions. Results and message was released through my chart.

## 2018-05-04 NOTE — Telephone Encounter (Signed)
-----   Message from Margaretann Loveless, PA-C sent at 05/04/2018  8:59 AM EDT ----- All labs are within normal limits and stable.  Thanks! -JB

## 2018-07-20 ENCOUNTER — Other Ambulatory Visit: Payer: Self-pay | Admitting: Physician Assistant

## 2018-07-20 DIAGNOSIS — E039 Hypothyroidism, unspecified: Secondary | ICD-10-CM

## 2018-09-04 ENCOUNTER — Other Ambulatory Visit: Payer: Self-pay | Admitting: Physician Assistant

## 2018-09-04 DIAGNOSIS — I10 Essential (primary) hypertension: Secondary | ICD-10-CM

## 2018-12-15 ENCOUNTER — Encounter: Payer: Self-pay | Admitting: Physician Assistant

## 2018-12-15 DIAGNOSIS — E034 Atrophy of thyroid (acquired): Secondary | ICD-10-CM

## 2018-12-26 LAB — TSH: TSH: 2.83 u[IU]/mL (ref 0.450–4.500)

## 2018-12-28 ENCOUNTER — Encounter: Payer: Self-pay | Admitting: Physician Assistant

## 2018-12-28 DIAGNOSIS — E034 Atrophy of thyroid (acquired): Secondary | ICD-10-CM

## 2018-12-28 MED ORDER — LEVOTHYROXINE SODIUM 125 MCG PO TABS: 125.0000 ug | ORAL_TABLET | Freq: Every day | ORAL | 1 refills | Status: DC

## 2019-01-21 IMAGING — MG MM DIGITAL DIAGNOSTIC UNILAT*R* W/ TOMO W/ CAD
6 series · 6 of 14 positions shown · non-contrast
Comparison: 10/04/2016, 12/30/2011

CLINICAL DATA: Asymmetry right breast identified on one view of the
recent 2D screening mammogram.

EXAM:
2D DIGITAL DIAGNOSTIC UNILATERAL RIGHT MAMMOGRAM WITH CAD AND
ADJUNCT TOMO

[R MLO synth-2D]
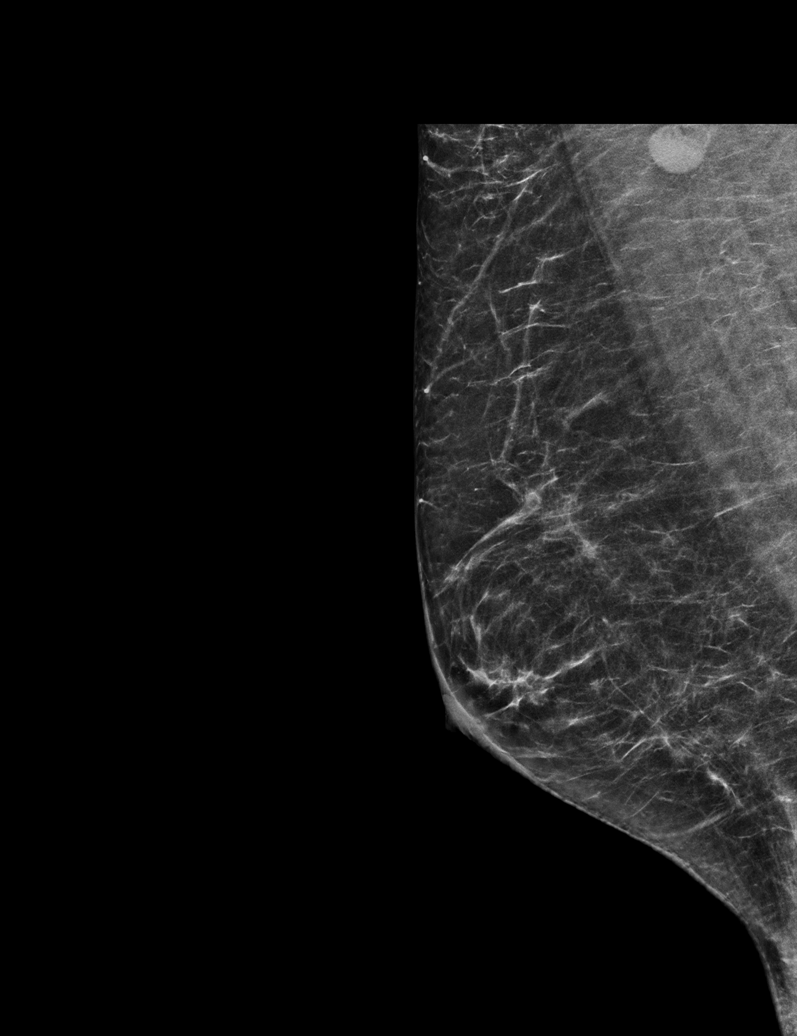

[R CC synth-2D]
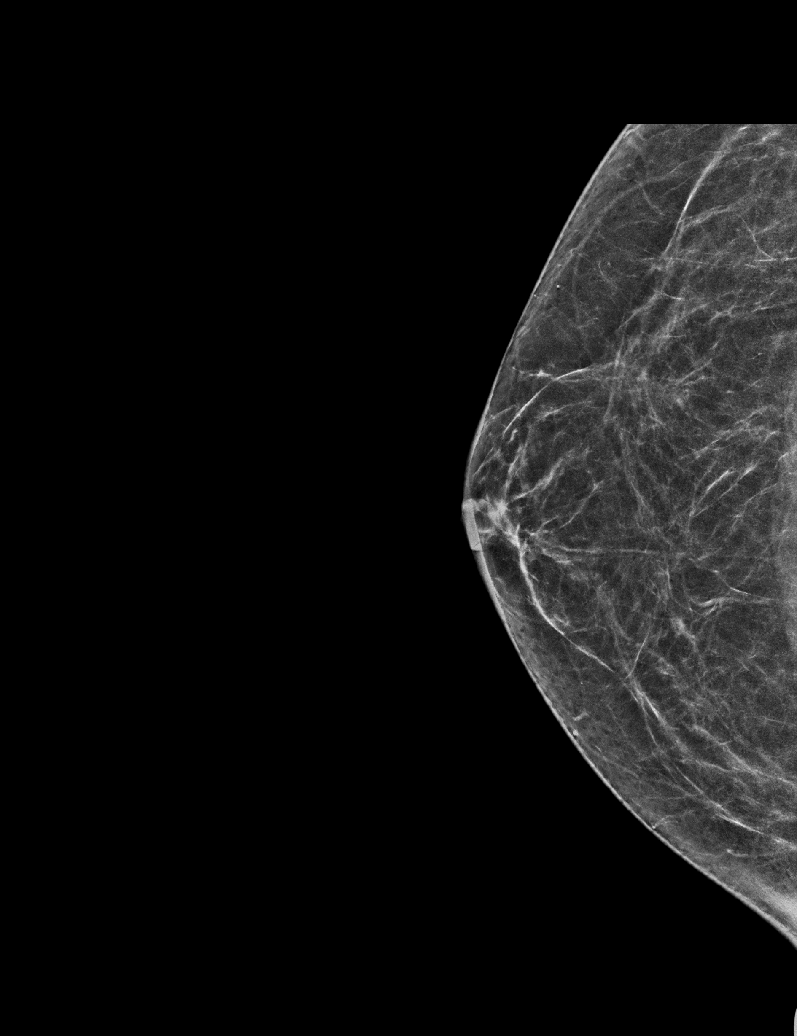

[R MLO]
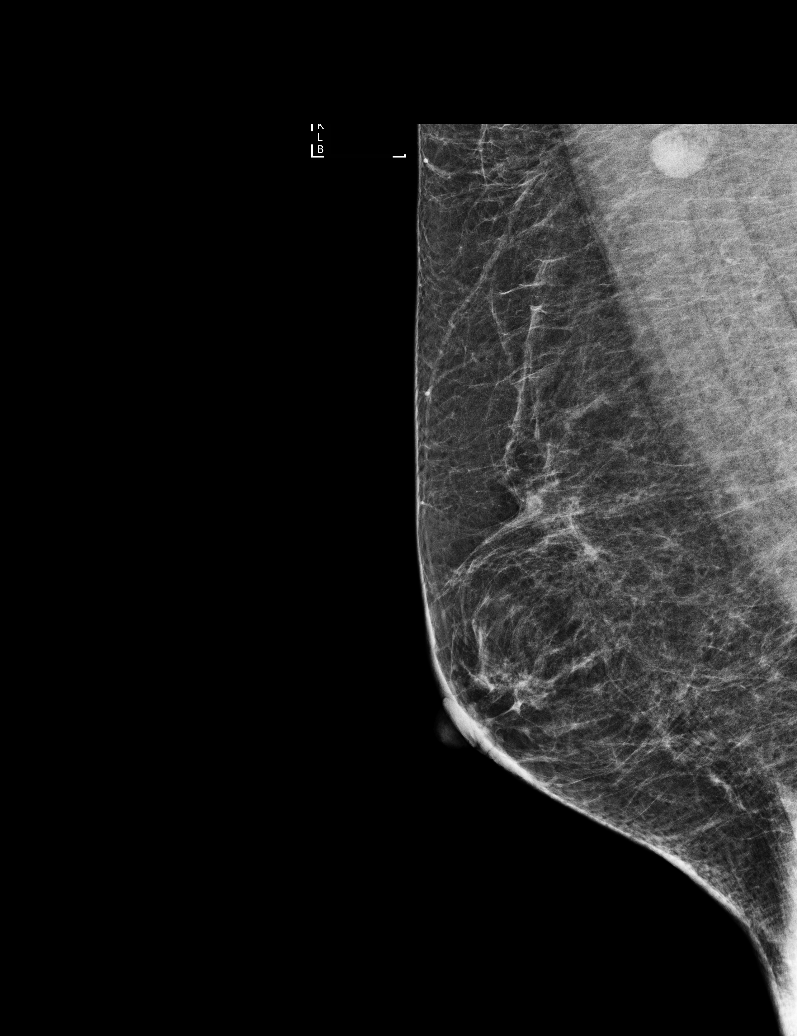

[R CC]
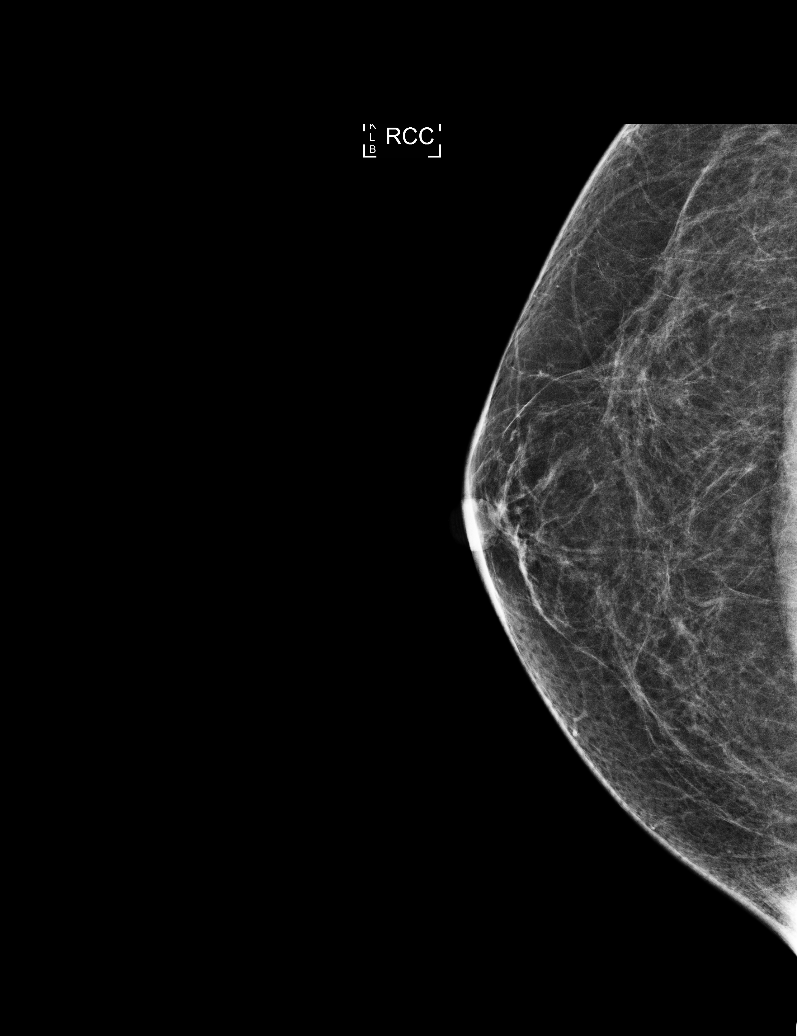

[R MLO tomo · tomo slice 30/59.0]
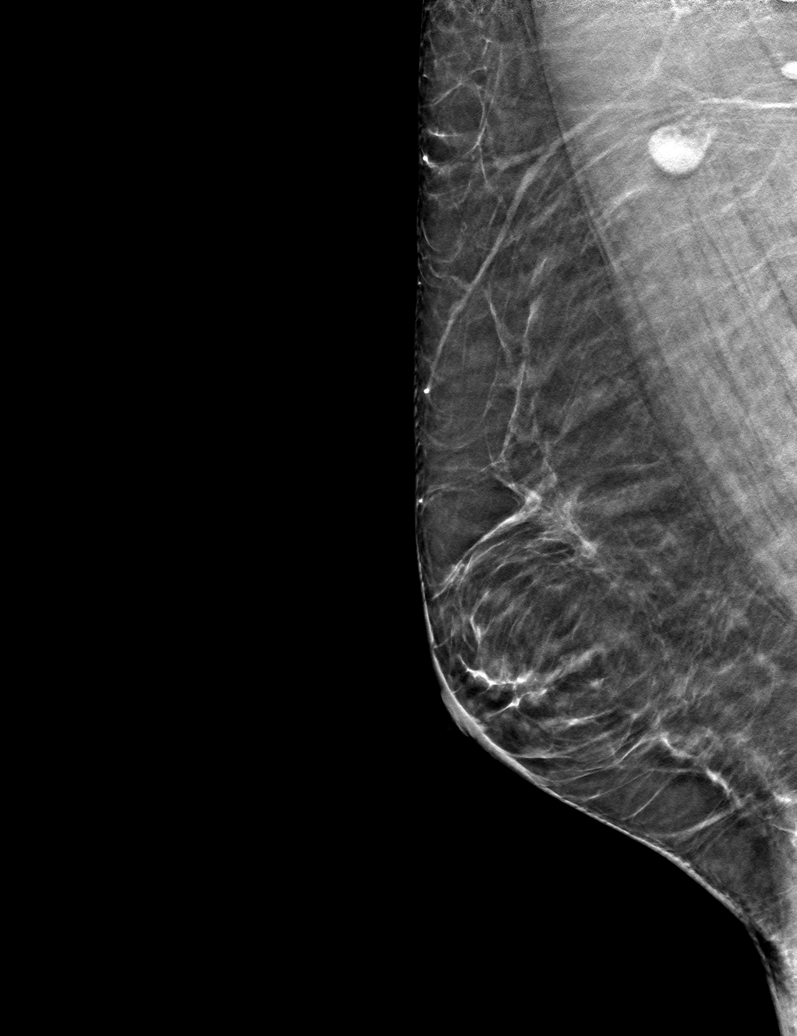

[R CC tomo · tomo slice 28/55.0]
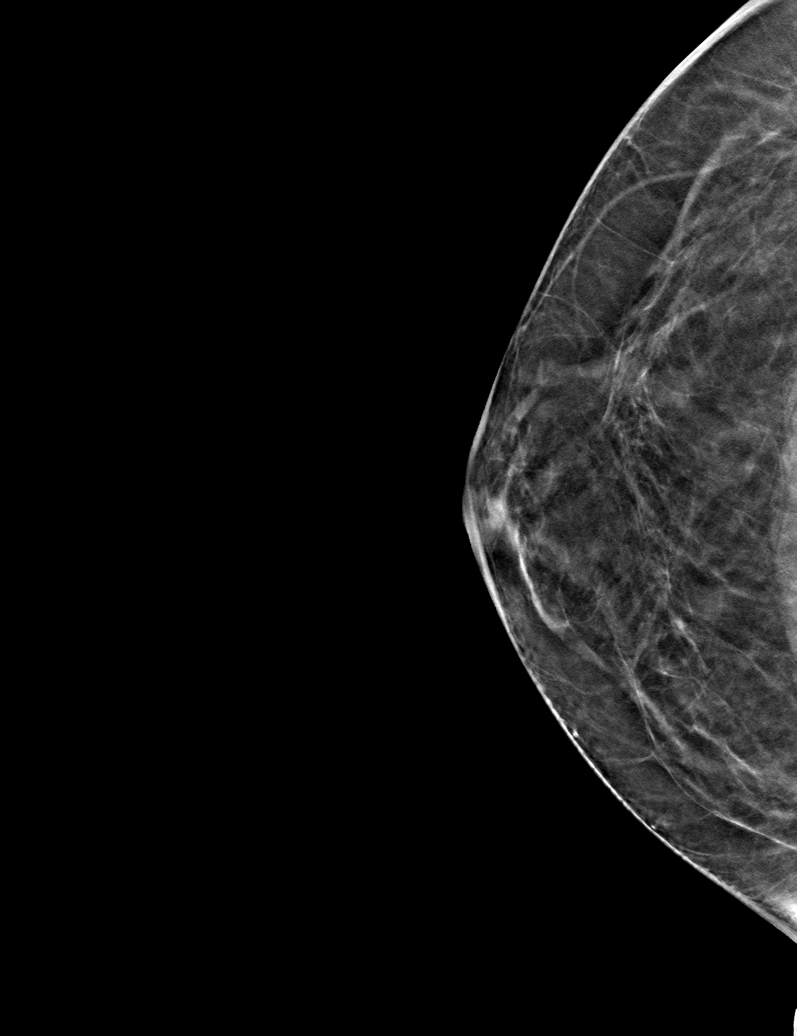

[6 of 14 positions shown; findings below may reference images not displayed]

ACR Breast Density Category b: There are scattered areas of
fibroglandular density.
FINDINGS: Whole breast CC and MLO images including tomography show no evidence
of mass or distortion in the right breast. The asymmetry seen behind
the nipple on the recent 2D screening mammogram is due to normal
fibroglandular tissue.

Mammographic images were processed with CAD.
IMPRESSION: No evidence of malignancy in the right breast.

RECOMMENDATION:
Screening mammogram in one year.(Code:PA-1-R2V)

I have discussed the findings and recommendations with the patient.
Results were also provided in writing at the conclusion of the
visit. If applicable, a reminder letter will be sent to the patient
regarding the next appointment.

BI-RADS CATEGORY  1: Negative.

## 2019-02-05 ENCOUNTER — Other Ambulatory Visit: Payer: Self-pay

## 2019-02-05 ENCOUNTER — Encounter: Payer: 59 | Admitting: Physician Assistant

## 2019-02-05 NOTE — Progress Notes (Signed)
Opened in error but patient left

## 2019-03-26 ENCOUNTER — Other Ambulatory Visit: Payer: Self-pay

## 2019-03-26 ENCOUNTER — Ambulatory Visit (INDEPENDENT_AMBULATORY_CARE_PROVIDER_SITE_OTHER): Payer: 59 | Admitting: Physician Assistant

## 2019-03-26 ENCOUNTER — Encounter: Payer: Self-pay | Admitting: Physician Assistant

## 2019-03-26 VITALS — BP 110/76 | HR 85 | Temp 96.9°F | Resp 16 | Ht 62.0 in | Wt 206.0 lb

## 2019-03-26 DIAGNOSIS — Z1239 Encounter for other screening for malignant neoplasm of breast: Secondary | ICD-10-CM | POA: Diagnosis not present

## 2019-03-26 DIAGNOSIS — E039 Hypothyroidism, unspecified: Secondary | ICD-10-CM | POA: Diagnosis not present

## 2019-03-26 DIAGNOSIS — I10 Essential (primary) hypertension: Secondary | ICD-10-CM | POA: Diagnosis not present

## 2019-03-26 DIAGNOSIS — E559 Vitamin D deficiency, unspecified: Secondary | ICD-10-CM

## 2019-03-26 DIAGNOSIS — Z0000 Encounter for general adult medical examination without abnormal findings: Secondary | ICD-10-CM | POA: Diagnosis not present

## 2019-03-26 DIAGNOSIS — Z6837 Body mass index (BMI) 37.0-37.9, adult: Secondary | ICD-10-CM

## 2019-03-26 DIAGNOSIS — E66812 Obesity, class 2: Secondary | ICD-10-CM

## 2019-03-26 NOTE — Progress Notes (Signed)
Patient: Erika Hart, Female    DOB: 04-21-1976, 43 y.o.   MRN: [id number] Visit Date: 03/26/2019  Today's Provider: Mar Daring, PA-C   Chief Complaint  Patient presents with  . Annual Exam   Subjective:     Annual physical exam Erika Hart is a 43 y.o. female who presents today for health maintenance and complete physical. She feels well. She reports exercising daily. She reports she is sleeping fairly well.  02/27/2018 CPE 02/27/2018 Pap/hpv-negative -----------------------------------------------------------------   Review of Systems  Constitutional: Negative.   HENT: Negative.   Eyes: Positive for visual disturbance.  Respiratory: Negative.   Cardiovascular: Positive for palpitations.  Gastrointestinal: Positive for abdominal distention.  Endocrine: Positive for cold intolerance and polyuria.  Genitourinary: Negative.   Musculoskeletal: Positive for arthralgias, myalgias and neck pain.  Skin: Negative.   Allergic/Immunologic: Negative.   Neurological: Positive for headaches.  Hematological: Positive for adenopathy.  Psychiatric/Behavioral: Negative.   All positive ROS are improving. Patient started Plexus and is feeling better.  Social History      She  reports that she has never smoked. She has never used smokeless tobacco. She reports current alcohol use. She reports that she does not use drugs.       Social History   Socioeconomic History  . Marital status: Married    Spouse name: Not on file  . Number of children: Not on file  . Years of education: Not on file  . Highest education level: Not on file  Occupational History  . Not on file  Social Needs  . Financial resource strain: Not on file  . Food insecurity    Worry: Not on file    Inability: Not on file  . Transportation needs    Medical: Not on file    Non-medical: Not on file  Tobacco Use  . Smoking status: Never Smoker  . Smokeless tobacco: Never Used   Substance and Sexual Activity  . Alcohol use: Yes    Comment: 1-2 a month  . Drug use: No  . Sexual activity: Not on file  Lifestyle  . Physical activity    Days per week: Not on file    Minutes per session: Not on file  . Stress: Not on file  Relationships  . Social Herbalist on phone: Not on file    Gets together: Not on file    Attends religious service: Not on file    Active member of club or organization: Not on file    Attends meetings of clubs or organizations: Not on file    Relationship status: Not on file  Other Topics Concern  . Not on file  Social History Narrative  . Not on file    Past Medical History:  Diagnosis Date  . Allergy   . Arthritis   . GERD (gastroesophageal reflux disease)   . Hypertension   . Thyroid disease      Patient Active Problem List   Diagnosis Date Noted  . Pilon fracture of right tibia, open type I or II, initial encounter 08/20/2017  . Bulge of lumbar disc without myelopathy 10/20/2015  . BP (high blood pressure) 10/20/2015  . Acquired hypothyroidism 10/20/2015  . Headache, migraine 10/20/2015  . Adult BMI 30+ 10/20/2015  . Esophageal reflux 10/20/2015    Past Surgical History:  Procedure Laterality Date  . APPLICATION OF WOUND VAC Right 08/20/2017   Procedure: APPLICATION OF WOUND VAC;  Surgeon: Roby LoftsHaddix, Kevin P, MD;  Location: [facility] OR;  Service: Orthopedics;  Laterality: Right;  . CESAREAN SECTION  2004  . I&D EXTREMITY Right 08/20/2017   Procedure: IRRIGATION AND DEBRIDEMENT EXTREMITY;  Surgeon: Roby LoftsHaddix, Kevin P, MD;  Location: [facility] OR;  Service: Orthopedics;  Laterality: Right;  . OPEN REDUCTION INTERNAL FIXATION (ORIF) TIBIA/FIBULA FRACTURE Right 08/20/2017   Procedure: OPEN REDUCTION INTERNAL FIXATION (ORIF) TIBIA/FIBULA FRACTURE;  Surgeon: Roby LoftsHaddix, Kevin P, MD;  Location: [facility] OR;  Service: Orthopedics;  Laterality: Right;  . ORIF ANKLE FRACTURE Right [id number]/232019   WOUND VAC APPLICATION    Family History         Family Status  Relation Name Status  . Mother  Alive  . Father  Deceased  . Brother  Alive  . Daughter  Alive  . Neg Hx  (Not Specified)        Her family history includes Diabetes in her father; Healthy in her daughter; Stroke in her father; Thyroid disease in her mother. There is no history of Breast cancer.      Allergies  Allergen Reactions  . Sulfa Antibiotics Rash     Current Outpatient Medications:  .  acetaminophen (TYLENOL) 500 MG tablet, Take 500 mg by mouth every 6 (six) hours as needed for headache., Disp: , Rfl:  .  Aspirin-Acetaminophen-Caffeine (EXCEDRIN MIGRAINE PO), Take by mouth., Disp: , Rfl:  .  CALCIUM CITRATE PO, Take by mouth., Disp: , Rfl:  .  Cholecalciferol (VITAMIN D PO), Take 1 tablet by mouth daily., Disp: , Rfl:  .  Cyanocobalamin (RA VITAMIN B-12 PO), Take by mouth., Disp: , Rfl:  .  hydrochlorothiazide (MICROZIDE) 12.5 MG capsule, TAKE 1 CAPSULE BY MOUTH EVERY DAY, Disp: 90 capsule, Rfl: 1 .  levothyroxine (SYNTHROID) 125 MCG tablet, Take 1 tablet (125 mcg total) by mouth daily., Disp: 90 tablet, Rfl: 1   Patient Care Team: Reine JustBurnette, Bailea M, PA-C as PCP - General (Family Medicine)    Objective:    Vitals: BP 110/76 (BP Location: Left Arm, Patient Position: Sitting, Cuff Size: Normal)   Pulse 85   Temp (!) 96.9 F (36.1 C) (Temporal)   Resp 16   Ht 5\' 2"  (1.575 m)   Wt 206 lb (93.4 kg)   BMI 37.68 kg/m    Vitals:   03/26/19 1016  BP: 110/76  Pulse: 85  Resp: 16  Temp: (!) 96.9 F (36.1 C)  TempSrc: Temporal  Weight: 206 lb (93.4 kg)  Height: 5\' 2"  (1.575 m)     Physical Exam Vitals signs reviewed.  Constitutional:      General: She is not in acute distress.    Appearance: Normal appearance. She is well-developed. She is obese. She is not ill-appearing or diaphoretic.  HENT:     Head: Normocephalic and atraumatic.     Right Ear: Tympanic membrane, ear canal and external ear normal.     Left Ear: Tympanic membrane, ear  canal and external ear normal.     Nose: Nose normal.     Mouth/Throat:     Mouth: Mucous membranes are moist.     Pharynx: No oropharyngeal exudate.  Eyes:     General: No scleral icterus.       Right eye: No discharge.        Left eye: No discharge.     Extraocular Movements: Extraocular movements intact.     Conjunctiva/sclera: Conjunctivae normal.     Pupils: Pupils are equal, round, and reactive to light.  Neck:     Musculoskeletal: Normal range of motion and neck supple.     Thyroid: No thyromegaly.     Vascular: No JVD.     Trachea: No tracheal deviation.  Cardiovascular:     Rate and Rhythm: Normal rate and regular rhythm.     Pulses: Normal pulses.     Heart sounds: Normal heart sounds. No murmur. No friction rub. No gallop.   Pulmonary:     Effort: Pulmonary effort is normal. No respiratory distress.     Breath sounds: Normal breath sounds. No wheezing or rales.  Chest:     Chest wall: No tenderness.     Breasts:        Right: Normal.        Left: Normal.  Abdominal:     General: Abdomen is flat. Bowel sounds are normal. There is no distension.     Palpations: Abdomen is soft. There is no mass.     Tenderness: There is no abdominal tenderness. There is no guarding or rebound.  Musculoskeletal: Normal range of motion.        General: No tenderness.     Right lower leg: No edema.     Left lower leg: No edema.  Lymphadenopathy:     Cervical: No cervical adenopathy.  Skin:    General: Skin is warm and dry.     Capillary Refill: Capillary refill takes less than 2 seconds.     Findings: No rash.  Neurological:     General: No focal deficit present.     Mental Status: She is alert and oriented to person, place, and time. Mental status is at baseline.  Psychiatric:        Behavior: Behavior normal.        Thought Content: Thought content normal.        Judgment: Judgment normal.      Depression Screen PHQ 2/9 Scores 03/26/2019 02/27/2018 09/06/2016  PHQ - 2 Score  0 1 0  PHQ- 9 Score 0 8 -       Assessment & Plan:     Routine Health Maintenance and Physical Exam  Exercise Activities and Dietary recommendations Goals   None     Immunization History  Administered Date(s) Administered  . Tdap 02/09/2008, 08/19/2017    Health Maintenance  Topic Date Due  . INFLUENZA VACCINE  02/27/2019  . PAP SMEAR-Modifier  02/27/2021  . TETANUS/TDAP  08/20/2027  . HIV Screening  Completed     Discussed health benefits of physical activity, and encouraged her to engage in regular exercise appropriate for her age and condition.    1. Annual physical exam Normal physical exam today. Will check labs as below and f/u pending lab results. If labs are stable and WNL she will not need to have these rechecked for one year at her next annual physical exam. She is to call the office in the meantime if she has any acute issue, questions or concerns. - CBC w/Diff/Platelet - Comprehensive Metabolic Panel (CMET) - TSH - Lipid Profile - HgB A1c - Vitamin D (25 hydroxy)  2. Breast cancer screening Breast exam today was normal. There is no family history of breast cancer. She does perform regular self breast exams. Mammogram was ordered as below. [facility] given to patient so she may schedule her mammogram at her convenience.  3. Essential hypertension Stable. Will check labs as below and f/u pending results. - CBC w/Diff/Platelet - Comprehensive Metabolic  Panel (CMET) - TSH - Lipid Profile - HgB A1c  4. Acquired hypothyroidism Stable. Continue levothyroxine . Will check labs as below and f/u pending results. - CBC w/Diff/Platelet - Comprehensive Metabolic Panel (CMET) - TSH  5. Class 2 severe obesity due to excess calories with serious comorbidity and body mass index (BMI) of 37.0 to 37.9 in adult [facility]) Counseled patient on healthy lifestyle modifications including dieting and exercise.  Started Plexus program.   - CBC w/Diff/Platelet - Comprehensive Metabolic Panel (CMET) - TSH - Lipid Profile - HgB A1c  6. Vitamin D deficiency H/O this. Will check labs as below and f/u pending results. - CBC w/Diff/Platelet - Comprehensive Metabolic Panel (CMET) - Vitamin D (25 hydroxy)  --------------------------------------------------------------------    Margaretann Loveless, PA-C  [facility]

## 2019-03-26 NOTE — Patient Instructions (Signed)
Health Maintenance, Female Adopting a healthy lifestyle and getting preventive care are important in promoting health and wellness. Ask your health care provider about:  The right schedule for you to have regular tests and exams.  Things you can do on your own to prevent diseases and keep yourself healthy. What should I know about diet, weight, and exercise? Eat a healthy diet   Eat a diet that includes plenty of vegetables, fruits, low-fat dairy products, and lean protein.  Do not eat a lot of foods that are high in solid fats, added sugars, or sodium. Maintain a healthy weight Body mass index (BMI) is used to identify weight problems. It estimates body fat based on height and weight. Your health care provider can help determine your BMI and help you achieve or maintain a healthy weight. Get regular exercise Get regular exercise. This is one of the most important things you can do for your health. Most adults should:  Exercise for at least 150 minutes each week. The exercise should increase your heart rate and make you sweat (moderate-intensity exercise).  Do strengthening exercises at least twice a week. This is in addition to the moderate-intensity exercise.  Spend less time sitting. Even light physical activity can be beneficial. Watch cholesterol and blood lipids Have your blood tested for lipids and cholesterol at 45 years of age, then have this test every 5 years. Have your cholesterol levels checked more often if:  Your lipid or cholesterol levels are high.  You are older than 45 years of age.  You are at high risk for heart disease. What should I know about cancer screening? Depending on your health history and family history, you may need to have cancer screening at various ages. This may include screening for:  Breast cancer.  Cervical cancer.  Colorectal cancer.  Skin cancer.  Lung cancer. What should I know about heart disease, diabetes, and high blood  pressure? Blood pressure and heart disease  High blood pressure causes heart disease and increases the risk of stroke. This is more likely to develop in people who have high blood pressure readings, are of African descent, or are overweight.  Have your blood pressure checked: ? Every 3-5 years if you are 18-39 years of age. ? Every year if you are 40 years old or older. Diabetes Have regular diabetes screenings. This checks your fasting blood sugar level. Have the screening done:  Once every three years after age 40 if you are at a normal weight and have a low risk for diabetes.  More often and at a younger age if you are overweight or have a high risk for diabetes. What should I know about preventing infection? Hepatitis B If you have a higher risk for hepatitis B, you should be screened for this virus. Talk with your health care provider to find out if you are at risk for hepatitis B infection. Hepatitis C Testing is recommended for:  Everyone born from 1945 through 1965.  Anyone with known risk factors for hepatitis C. Sexually transmitted infections (STIs)  Get screened for STIs, including gonorrhea and chlamydia, if: ? You are sexually active and are younger than 45 years of age. ? You are older than 45 years of age and your health care provider tells you that you are at risk for this type of infection. ? Your sexual activity has changed since you were last screened, and you are at increased risk for chlamydia or gonorrhea. Ask your health care provider if   you are at risk.  Ask your health care provider about whether you are at high risk for HIV. Your health care provider may recommend a prescription medicine to help prevent HIV infection. If you choose to take medicine to prevent HIV, you should first get tested for HIV. You should then be tested every 3 months for as long as you are taking the medicine. Pregnancy  If you are about to stop having your period (premenopausal) and  you may become pregnant, seek counseling before you get pregnant.  Take 400 to 800 micrograms (mcg) of folic acid every day if you become pregnant.  Ask for birth control (contraception) if you want to prevent pregnancy. Osteoporosis and menopause Osteoporosis is a disease in which the bones lose minerals and strength with aging. This can result in bone fractures. If you are 65 years old or older, or if you are at risk for osteoporosis and fractures, ask your health care provider if you should:  Be screened for bone loss.  Take a calcium or vitamin D supplement to lower your risk of fractures.  Be given hormone replacement therapy (HRT) to treat symptoms of menopause. Follow these instructions at home: Lifestyle  Do not use any products that contain nicotine or tobacco, such as cigarettes, e-cigarettes, and chewing tobacco. If you need help quitting, ask your health care provider.  Do not use street drugs.  Do not share needles.  Ask your health care provider for help if you need support or information about quitting drugs. Alcohol use  Do not drink alcohol if: ? Your health care provider tells you not to drink. ? You are pregnant, may be pregnant, or are planning to become pregnant.  If you drink alcohol: ? Limit how much you use to 0-1 drink a day. ? Limit intake if you are breastfeeding.  Be aware of how much alcohol is in your drink. In the [place] one drink equals one 12 oz bottle of beer (355 mL), one 5 oz glass of wine (148 mL), or one 1 oz glass of hard liquor (44 mL). General instructions  Schedule regular health, dental, and eye exams.  Stay current with your vaccines.  Tell your health care provider if: ? You often feel depressed. ? You have ever been abused or do not feel safe at home. Summary  Adopting a healthy lifestyle and getting preventive care are important in promoting health and wellness.  Follow your health care provider's instructions about healthy  diet, exercising, and getting tested or screened for diseases.  Follow your health care provider's instructions on monitoring your cholesterol and blood pressure. This information is not intended to replace advice given to you by your health care provider. Make sure you discuss any questions you have with your health care provider. Document Released: 01/28/2011 Document Revised: 07/08/2018 Document Reviewed: 07/08/2018 Elsevier Patient Education  2020 Elsevier Inc.  

## 2019-03-27 LAB — CBC WITH DIFFERENTIAL/PLATELET
Basophils Absolute: 0.1 10*3/uL (ref 0.0–0.2)
Basos: 1 %
EOS (ABSOLUTE): 0.1 10*3/uL (ref 0.0–0.4)
Eos: 2 %
Hematocrit: 44 % (ref 34.0–46.6)
Hemoglobin: 14.9 g/dL (ref 11.1–15.9)
Immature Grans (Abs): 0 10*3/uL (ref 0.0–0.1)
Immature Granulocytes: 0 %
Lymphocytes Absolute: 1.4 10*3/uL (ref 0.7–3.1)
Lymphs: 32 %
MCH: 30.4 pg (ref 26.6–33.0)
MCHC: 33.9 g/dL (ref 31.5–35.7)
MCV: 90 fL (ref 79–97)
Monocytes Absolute: 0.4 10*3/uL (ref 0.1–0.9)
Monocytes: 9 %
Neutrophils Absolute: 2.5 10*3/uL (ref 1.4–7.0)
Neutrophils: 56 %
Platelets: 312 10*3/uL (ref 150–450)
RBC: 4.9 x10E6/uL (ref 3.77–5.28)
RDW: 12.5 % (ref 11.7–15.4)
WBC: 4.4 10*3/uL (ref 3.4–10.8)

## 2019-03-27 LAB — TSH: TSH: 2.11 u[IU]/mL (ref 0.450–4.500)

## 2019-03-27 LAB — COMPREHENSIVE METABOLIC PANEL
ALT: 13 IU/L (ref 0–32)
AST: 19 IU/L (ref 0–40)
Albumin/Globulin Ratio: 1.4 (ref 1.2–2.2)
Albumin: 4.4 g/dL (ref 3.8–4.8)
Alkaline Phosphatase: 51 IU/L (ref 39–117)
BUN/Creatinine Ratio: 19 (ref 9–23)
BUN: 17 mg/dL (ref 6–24)
Bilirubin Total: 0.8 mg/dL (ref 0.0–1.2)
CO2: 24 mmol/L (ref 20–29)
Calcium: 10.1 mg/dL (ref 8.7–10.2)
Chloride: 100 mmol/L (ref 96–106)
Creatinine, Ser: 0.88 mg/dL (ref 0.57–1.00)
GFR calc Af Amer: 92 mL/min/{1.73_m2} (ref >59)
GFR calc non Af Amer: 80 mL/min/{1.73_m2} (ref >59)
Globulin, Total: 3.1 g/dL (ref 1.5–4.5)
Glucose: 81 mg/dL (ref 65–99)
Potassium: 4.1 mmol/L (ref 3.5–5.2)
Sodium: 139 mmol/L (ref 134–144)
Total Protein: 7.5 g/dL (ref 6.0–8.5)

## 2019-03-27 LAB — HEMOGLOBIN A1C
Est. average glucose Bld gHb Est-mCnc: 103 mg/dL
Hgb A1c MFr Bld: 5.2 % (ref 4.8–5.6)

## 2019-03-27 LAB — LIPID PANEL
Chol/HDL Ratio: 2.3 ratio (ref 0.0–4.4)
Cholesterol, Total: 143 mg/dL (ref 100–199)
HDL: 61 mg/dL (ref >39)
LDL Calculated: 71 mg/dL (ref 0–99)
Triglycerides: 54 mg/dL (ref 0–149)
VLDL Cholesterol Cal: 11 mg/dL (ref 5–40)

## 2019-03-27 LAB — VITAMIN D 25 HYDROXY (VIT D DEFICIENCY, FRACTURES): Vit D, 25-Hydroxy: 36.2 ng/mL (ref 30.0–100.0)

## 2019-03-30 ENCOUNTER — Telehealth: Payer: Self-pay

## 2019-03-30 NOTE — Telephone Encounter (Signed)
-----   Message from Mar Daring, [place] sent at 03/30/2019  2:31 PM EDT ----- Blood count is normal. Kidney and liver function are normal. Sodium, potassium and calcium are normal. Cholesterol is normal. Su[place]r/A1c is normal. Thyroid is normal and stable at 2.110. Vit D is lower normal. Recommend to continue OTC Vit D supplement.

## 2019-03-30 NOTE — Telephone Encounter (Signed)
Patient advised as directed below. 

## 2019-04-12 ENCOUNTER — Other Ambulatory Visit: Payer: Self-pay | Admitting: Physician Assistant

## 2019-04-12 DIAGNOSIS — I10 Essential (primary) hypertension: Secondary | ICD-10-CM

## 2019-05-10 ENCOUNTER — Encounter: Payer: Self-pay | Admitting: Physician Assistant

## 2019-05-10 DIAGNOSIS — I10 Essential (primary) hypertension: Secondary | ICD-10-CM

## 2019-05-10 MED ORDER — HYDROCHLOROTHIAZIDE 12.5 MG PO CAPS: 12.5000 mg | ORAL_CAPSULE | Freq: Every day | ORAL | 3 refills | Status: DC

## 2019-07-08 ENCOUNTER — Other Ambulatory Visit: Payer: Self-pay | Admitting: Physician Assistant

## 2019-07-08 DIAGNOSIS — E034 Atrophy of thyroid (acquired): Secondary | ICD-10-CM

## 2019-10-20 ENCOUNTER — Encounter: Payer: Self-pay | Admitting: Physician Assistant

## 2019-10-20 NOTE — Telephone Encounter (Signed)
Advised pt that she will need an office visit to discuss weight loss options.  Pt is going to think about it call to schedule an appointment later.   Thanks,   -Vernona Rieger

## 2019-11-19 ENCOUNTER — Ambulatory Visit: Payer: 59 | Admitting: Physician Assistant

## 2019-11-19 ENCOUNTER — Other Ambulatory Visit: Payer: Self-pay

## 2019-11-19 ENCOUNTER — Encounter: Payer: Self-pay | Admitting: Physician Assistant

## 2019-11-19 VITALS — BP 126/75 | HR 84 | Temp 97.0°F | Resp 16 | Wt 203.4 lb

## 2019-11-19 DIAGNOSIS — R251 Tremor, unspecified: Secondary | ICD-10-CM

## 2019-11-19 DIAGNOSIS — R635 Abnormal weight gain: Secondary | ICD-10-CM

## 2019-11-19 DIAGNOSIS — R6889 Other general symptoms and signs: Secondary | ICD-10-CM

## 2019-11-19 DIAGNOSIS — R5382 Chronic fatigue, unspecified: Secondary | ICD-10-CM | POA: Diagnosis not present

## 2019-11-19 DIAGNOSIS — N926 Irregular menstruation, unspecified: Secondary | ICD-10-CM | POA: Diagnosis not present

## 2019-11-19 NOTE — Progress Notes (Signed)
Established patient visit   Patient: Erika Hart   DOB: July 29, 1976   44 y.o. Female  MRN: [id number] Visit Date: 11/19/2019  Today's healthcare provider: Mar Daring, PA-C   Chief Complaint  Patient presents with  . Follow-up    Thyroid   Subjective    HPI Patient here to follow-up on thyroid and blood work.  Thyroid: Patient presents for follow up hypothyroidism. Current symptoms include fatigue, feeling cold and cold intolerance, sweating. Patient denies anxiousness, feeling excessive energy, palpitations, weight loss, change in skin,  nails, or hair.   Reports that for her weight loss she is going to go to a different provider, Dr. Ouida Sills, because we don't give the Lipotropic injections.  Patient Active Problem List   Diagnosis Date Noted  . Pilon fracture of right tibia, open type I or II, initial encounter 08/20/2017  . Bulge of lumbar disc without myelopathy 10/20/2015  . BP (high blood pressure) 10/20/2015  . Acquired hypothyroidism 10/20/2015  . Headache, migraine 10/20/2015  . Adult BMI 30+ 10/20/2015  . Esophageal reflux 10/20/2015   Past Medical History:  Diagnosis Date  . Allergy   . Arthritis   . GERD (gastroesophageal reflux disease)   . Hypertension   . Thyroid disease    Allergies  Allergen Reactions  . Sulfa Antibiotics Rash       Medications: Outpatient Medications Prior to Visit  Medication Sig  . acetaminophen (TYLENOL) 500 MG tablet Take 500 mg by mouth every 6 (six) hours as needed for headache.  . Aspirin-Acetaminophen-Caffeine (EXCEDRIN MIGRAINE PO) Take by mouth.  Marland Kitchen CALCIUM CITRATE PO Take by mouth.  . Cholecalciferol (VITAMIN D PO) Take 1 tablet by mouth daily.  . hydrochlorothiazide (MICROZIDE) 12.5 MG capsule Take 1 capsule (12.5 mg total) by mouth daily.  Marland Kitchen levothyroxine (SYNTHROID) 125 MCG tablet TAKE 1 TABLET BY MOUTH EVERY DAY  . Cyanocobalamin (RA VITAMIN B-12 PO) Take by mouth.   No  facility-administered medications prior to visit.    Review of Systems  Constitutional: Positive for appetite change, diaphoresis and fatigue.  HENT: Negative.   Eyes: Negative.   Respiratory: Negative.   Cardiovascular: Positive for palpitations.  Gastrointestinal: Positive for abdominal distention and abdominal pain.  Endocrine: Positive for cold intolerance and polyuria.  Genitourinary: Positive for frequency.  Musculoskeletal: Negative.   Skin: Negative.   Allergic/Immunologic: Positive for environmental allergies.  Neurological: Negative.   Hematological: Negative.   Psychiatric/Behavioral: The patient is nervous/anxious.     Last CBC Lab Results  Component Value Date   WBC 4.4 03/26/2019   HGB 14.9 03/26/2019   HCT 44.0 03/26/2019   MCV 90 03/26/2019   MCH 30.4 03/26/2019   RDW 12.5 03/26/2019   PLT 312 97/35/3299   Last metabolic panel Lab Results  Component Value Date   GLUCOSE 81 03/26/2019   NA 139 03/26/2019   K 4.1 03/26/2019   CL 100 03/26/2019   CO2 24 03/26/2019   BUN 17 03/26/2019   CREATININE 0.88 03/26/2019   GFRNONAA 80 03/26/2019   GFRAA 92 03/26/2019   CALCIUM 10.1 03/26/2019   PROT 7.5 03/26/2019   ALBUMIN 4.4 03/26/2019   LABGLOB 3.1 03/26/2019   AGRATIO 1.4 03/26/2019   BILITOT 0.8 03/26/2019   ALKPHOS 51 03/26/2019   AST 19 03/26/2019   ALT 13 03/26/2019   ANIONGAP 7 08/20/2017   Last lipids Lab Results  Component Value Date   CHOL 143 03/26/2019   HDL 61 03/26/2019  LDLCALC 71 03/26/2019   TRIG 54 03/26/2019   CHOLHDL 2.3 03/26/2019   Last hemoglobin A1c Lab Results  Component Value Date   HGBA1C 5.2 03/26/2019   Last thyroid functions Lab Results  Component Value Date   TSH 2.110 03/26/2019   T4TOTAL 9.9 08/15/2017       Objective    BP 126/75 (BP Location: Right Arm, Patient Position: Sitting, Cuff Size: Normal)   Pulse 84   Temp (!) 97 F (36.1 C) (Temporal)   Resp 16   Wt 203 lb 6.4 oz (92.3 kg)   BMI  37.20 kg/m  BP Readings from Last 3 Encounters:  11/19/19 126/75  03/26/19 110/76  02/27/18 110/80   Wt Readings from Last 3 Encounters:  11/19/19 203 lb 6.4 oz (92.3 kg)  03/26/19 206 lb (93.4 kg)  02/27/18 189 lb (85.7 kg)      Physical Exam Vitals reviewed.  Constitutional:      General: She is not in acute distress.    Appearance: Normal appearance. She is well-developed. She is obese. She is not ill-appearing or diaphoretic.  Neck:     Thyroid: No thyromegaly.     Vascular: No JVD.     Trachea: No tracheal deviation.  Cardiovascular:     Rate and Rhythm: Normal rate and regular rhythm.     Pulses: Normal pulses.     Heart sounds: Normal heart sounds. No murmur. No friction rub. No gallop.   Pulmonary:     Effort: Pulmonary effort is normal. No respiratory distress.     Breath sounds: Normal breath sounds. No wheezing or rales.  Musculoskeletal:     Cervical back: Normal range of motion and neck supple. No tenderness.  Lymphadenopathy:     Cervical: No cervical adenopathy.  Neurological:     Mental Status: She is alert.  Psychiatric:        Mood and Affect: Mood normal.        Behavior: Behavior normal.        Thought Content: Thought content normal.        Judgment: Judgment normal.       No results found for any visits on 11/19/19.  Assessment & Plan    1. Chronic fatigue Waxes and wanes. Reports she can have times where she feels great for a few months, then will have this wave of intense fatigue over come her and she struggles with even basic activities. These waves can last for a month or longer, then just eventually disappear as quickly as they come on. Also having irregular menstrual cycles with cold intolerance and difficulty losing weight. Will check labs as below and r/o thyroid, PCOS, or peri-menopausal symptoms. I will f/u pending results.  - TSH + free T4 - CBC w/Diff/Platelet - Comprehensive Metabolic Panel (CMET) - HgB A1c - FSH/LH -  Estrogens, total - 17-Hydroxyprogesterone - Testosterone - B12 and Folate Panel  2. Missed menses See above medical treatment plan. - TSH + free T4 - CBC w/Diff/Platelet - Comprehensive Metabolic Panel (CMET) - HgB A1c - FSH/LH - Estrogens, total - 17-Hydroxyprogesterone - Testosterone - B12 and Folate Panel  3. Weight gain See above medical treatment plan. - TSH + free T4 - CBC w/Diff/Platelet - Comprehensive Metabolic Panel (CMET) - HgB A1c - FSH/LH - Estrogens, total - 17-Hydroxyprogesterone - Testosterone - B12 and Folate Panel  4. Shaking See above medical treatment plan. - TSH + free T4 - CBC w/Diff/Platelet - Comprehensive Metabolic Panel (CMET) -  HgB A1c - FSH/LH - Estrogens, total - 17-Hydroxyprogesterone - Testosterone - B12 and Folate Panel  5. Cold intolerance See above medical treatment plan. - TSH + free T4 - CBC w/Diff/Platelet - Comprehensive Metabolic Panel (CMET) - HgB A1c - FSH/LH - Estrogens, total - 17-Hydroxyprogesterone - Testosterone - B12 and Folate Panel No follow-ups on file.      Delmer Islam, PA-C, have reviewed all documentation for this visit. The documentation on 11/19/19 for the exam, diagnosis, procedures, and orders are all accurate and complete.   Reine Just  [facility] [phone] (phone) [phone] (fax)  [facility]

## 2019-11-19 NOTE — Patient Instructions (Signed)
10 Relaxation Techniques That Zap Stress Fast By Jeannette Moninger   Listen  Relax. You deserve it, it's good for you, and it takes less time than you think. You don't need a spa weekend or a retreat. Each of these stress-relieving tips can get you from OMG to om in less than 15 minutes. 1. Meditate  A few minutes of practice per day can help ease anxiety. "Research suggests that daily meditation may alter the brain's neural pathways, making you more resilient to stress," says psychologist Robbie Maller Hartman, PhD, a [place] health and wellness coach. It's simple. Sit up straight with both feet on the floor. Close your eyes. Focus your attention on reciting -- out loud or silently -- a positive mantra such as "I feel at peace" or "I love myself." Place one hand on your belly to sync the mantra with your breaths. Let any distracting thoughts float by like clouds. 2. Breathe Deeply  Take a 5-minute break and focus on your breathing. Sit up straight, eyes closed, with a hand on your belly. Slowly inhale through your nose, feeling the breath start in your abdomen and work its way to the top of your head. Reverse the process as you exhale through your mouth.  "Deep breathing counters the effects of stress by slowing the heart rate and lowering blood pressure," psychologist Judith Tutin, PhD, says. She's a certified life coach in [place], [place] 3. Be Present  Slow down.  "Take 5 minutes and focus on only one behavior with awareness," Tutin says. Notice how the air feels on your face when you're walking and how your feet feel hitting the ground. Enjoy the texture and taste of each bite of food. When you spend time in the moment and focus on your senses, you should feel less tense. 4. Reach Out  Your social network is one of your best tools for handling stress. Talk to others -- preferably face to face, or at least on the phone. Share what's going on. You can get a fresh perspective while keeping your  connection strong. 5. Tune In to Your Body  Mentally scan your body to get a sense of how stress affects it each day. Lie on your back, or sit with your feet on the floor. Start at your toes and work your way up to your scalp, noticing how your body feels.  10 Relaxation Techniques That Zap Stress Fast By Jeannette Moninger   Listen  "Simply be aware of places you feel tight or loose without trying to change anything," Tutin says. For 1 to 2 minutes, imagine each deep breath flowing to that body part. Repeat this process as you move your focus up your body, paying close attention to sensations you feel in each body part. 6. Decompress  Place a warm heat wrap around your neck and shoulders for 10 minutes. Close your eyes and relax your face, neck, upper chest, and back muscles. Remove the wrap, and use a tennis ball or foam roller to massage away tension.  "Place the ball between your back and the wall. Lean into the ball, and hold gentle pressure for up to 15 seconds. Then move the ball to another spot, and apply pressure," says Cathy Benninger, a nurse practitioner and assistant professor at [employer] [facility] in [place]. 7. Laugh Out Loud  A good belly laugh doesn't just lighten the load mentally. It lowers cortisol, your body's stress hormone, and boosts brain chemicals called endorphins, which help   your mood. Lighten up by tuning in to your favorite sitcom or video, reading the comics, or chatting with someone who makes you smile. 8. Crank Up the Tunes  Research shows that listening to soothing music can lower blood pressure, heart rate, and anxiety. "Create a playlist of songs or nature sounds (the ocean, a bubbling brook, birds chirping), and allow your mind to focus on the different melodies, instruments, or singers in the piece," Benninger says. You also can blow off steam by rocking out to more upbeat tunes -- or singing at the top of your lungs! 9. Get Moving   You don't have to run in order to get a runner's high. All forms of exercise, including yoga and walking, can ease depression and anxiety by helping the brain release feel-good chemicals and by giving your body a chance to practice dealing with stress. You can go for a quick walk around the block, take the stairs up and down a few flights, or do some stretching exercises like head rolls and shoulder shrugs. 10. Be Grateful  Keep a gratitude journal or several (one by your bed, one in your purse, and one at work) to help you remember all the things that are good in your life.  "Being grateful for your blessings cancels out negative thoughts and worries," says Joni Emmerling, a wellness coach in [place], [place].  Use these journals to savor good experiences like a child's smile, a sunshine-filled day, and good health. Don't forget to celebrate accomplishments like mastering a new task at work or a new hobby. When you start feeling stressed, spend a few minutes looking through your notes to remind yourself what really matters.  

## 2019-11-24 ENCOUNTER — Encounter: Payer: Self-pay | Admitting: Physician Assistant

## 2019-11-27 LAB — CBC WITH DIFFERENTIAL/PLATELET
Basophils Absolute: 0.1 10*3/uL (ref 0.0–0.2)
Basos: 1 %
EOS (ABSOLUTE): 0.1 10*3/uL (ref 0.0–0.4)
Eos: 2 %
Hematocrit: 45.3 % (ref 34.0–46.6)
Hemoglobin: 15.4 g/dL (ref 11.1–15.9)
Immature Grans (Abs): 0 10*3/uL (ref 0.0–0.1)
Immature Granulocytes: 0 %
Lymphocytes Absolute: 1.3 10*3/uL (ref 0.7–3.1)
Lymphs: 29 %
MCH: 30.7 pg (ref 26.6–33.0)
MCHC: 34 g/dL (ref 31.5–35.7)
MCV: 90 fL (ref 79–97)
Monocytes Absolute: 0.5 10*3/uL (ref 0.1–0.9)
Monocytes: 11 %
Neutrophils Absolute: 2.5 10*3/uL (ref 1.4–7.0)
Neutrophils: 57 %
Platelets: 285 10*3/uL (ref 150–450)
RBC: 5.01 x10E6/uL (ref 3.77–5.28)
RDW: 12.3 % (ref 11.7–15.4)
WBC: 4.4 10*3/uL (ref 3.4–10.8)

## 2019-11-27 LAB — COMPREHENSIVE METABOLIC PANEL
ALT: 30 IU/L (ref 0–32)
AST: 26 IU/L (ref 0–40)
Albumin/Globulin Ratio: 1.3 (ref 1.2–2.2)
Albumin: 4.3 g/dL (ref 3.8–4.8)
Alkaline Phosphatase: 83 IU/L (ref 39–117)
BUN/Creatinine Ratio: 23 (ref 9–23)
BUN: 19 mg/dL (ref 6–24)
Bilirubin Total: 0.8 mg/dL (ref 0.0–1.2)
CO2: 20 mmol/L (ref 20–29)
Calcium: 10 mg/dL (ref 8.7–10.2)
Chloride: 103 mmol/L (ref 96–106)
Creatinine, Ser: 0.82 mg/dL (ref 0.57–1.00)
GFR calc Af Amer: 100 mL/min/{1.73_m2} (ref >59)
GFR calc non Af Amer: 87 mL/min/{1.73_m2} (ref >59)
Globulin, Total: 3.2 g/dL (ref 1.5–4.5)
Glucose: 91 mg/dL (ref 65–99)
Potassium: 4.2 mmol/L (ref 3.5–5.2)
Sodium: 139 mmol/L (ref 134–144)
Total Protein: 7.5 g/dL (ref 6.0–8.5)

## 2019-11-27 LAB — TSH+FREE T4
Free T4: 1.82 ng/dL — ABNORMAL HIGH (ref 0.82–1.77)
TSH: 0.04 u[IU]/mL — ABNORMAL LOW (ref 0.450–4.500)

## 2019-11-27 LAB — 17-HYDROXYPROGESTERONE: 17-Hydroxyprogesterone: 14 ng/dL

## 2019-11-27 LAB — HEMOGLOBIN A1C
Est. average glucose Bld gHb Est-mCnc: 103 mg/dL
Hgb A1c MFr Bld: 5.2 % (ref 4.8–5.6)

## 2019-11-27 LAB — B12 AND FOLATE PANEL
Folate: 10.2 ng/mL (ref >3.0)
Vitamin B-12: 693 pg/mL (ref 232–1245)

## 2019-11-27 LAB — FSH/LH
FSH: 114 m[IU]/mL
LH: 66.5 m[IU]/mL

## 2019-11-27 LAB — ESTROGENS, TOTAL: Estrogen: 109 pg/mL

## 2019-11-27 LAB — TESTOSTERONE: Testosterone: 15 ng/dL (ref 8–48)

## 2019-11-29 ENCOUNTER — Other Ambulatory Visit: Payer: Self-pay

## 2019-11-29 DIAGNOSIS — E034 Atrophy of thyroid (acquired): Secondary | ICD-10-CM

## 2019-11-29 MED ORDER — LEVOTHYROXINE SODIUM 112 MCG PO TABS: 112.0000 ug | ORAL_TABLET | Freq: Every day | ORAL | 1 refills | Status: DC

## 2019-11-29 NOTE — Telephone Encounter (Signed)
-----   Message from Margaretann Loveless, [place] sent at 11/29/2019  1:01 PM EDT ----- Thyroid does show that it is being overcorrected. May benefit by decreasing dose to if agreeable I will change dose. Recommend to recheck in 6 weeks. Blood count is normal and stable. Sugar is normal. Kidney and liver function is normal. Sodium, potassium and calcium are normal. A1c is normal. LH and FSH rising and estrogen falling do indicate you are most likely in the year of menopause (where the hormones are fluctuating). You are not fully post-menopausal yet, but getting close. Testosterone and the 17-hydroxyprogesterone were normal range, indicating no polycystic ovarian issues. B12 and folate are normal.

## 2019-11-29 NOTE — Telephone Encounter (Signed)
Sent in and TSH ordered for recheck in 6 weeks.

## 2019-11-29 NOTE — Telephone Encounter (Signed)
Patient advised as directed below. Per patient she just got the refill of the thyroid medicine for 90 days supply, but she is ok getting the new prescription.

## 2020-01-23 ENCOUNTER — Other Ambulatory Visit: Payer: Self-pay | Admitting: Physician Assistant

## 2020-01-23 DIAGNOSIS — E034 Atrophy of thyroid (acquired): Secondary | ICD-10-CM

## 2020-01-23 NOTE — Telephone Encounter (Signed)
30 day RF- BW due Requested Prescriptions  Pending Prescriptions Disp Refills  . levothyroxine (SYNTHROID) 112 MCG tablet [Pharmacy Med Name: LEVOTHYROXINE 112 MCG TABLET] 30 tablet 0    Sig: TAKE 1 TABLET (112 MCG TOTAL) BY MOUTH DAILY BEFORE BREAKFAST.     Endocrinology:  Hypothyroid Agents Failed - 01/23/2020  9:52 AM      Failed - TSH needs to be rechecked within 3 months after an abnormal result. Refill until TSH is due.      Failed - TSH in normal range and within 360 days    TSH  Date Value Ref Range Status  11/19/2019 0.040 (L) 0.450 - 4.500 uIU/mL Final         Passed - Valid encounter within last 12 months    Recent Outpatient Visits          2 months ago Chronic fatigue   [facility] [place], K[place] M, [place]   10 months ago Annual physical exam   [facility] [place], Alessandra Bevels, [place]   1 year ago Annual physical exam   [facility] [place], Alessandra Bevels, [place]   1 year ago Cervical lymphadenitis   [facility] [place], Jodell Cipro, [place]   2 years ago Hypothyroidism, unspecified type   [facility], [place], [place]

## 2020-02-10 ENCOUNTER — Other Ambulatory Visit: Payer: Self-pay | Admitting: Physician Assistant

## 2020-02-10 DIAGNOSIS — E034 Atrophy of thyroid (acquired): Secondary | ICD-10-CM

## 2020-02-15 ENCOUNTER — Encounter: Payer: Self-pay | Admitting: Physician Assistant

## 2020-02-17 ENCOUNTER — Telehealth: Payer: Self-pay

## 2020-02-17 DIAGNOSIS — E039 Hypothyroidism, unspecified: Secondary | ICD-10-CM

## 2020-02-17 LAB — TSH+FREE T4
Free T4: 1.77 ng/dL (ref 0.82–1.77)
TSH: 0.023 u[IU]/mL — ABNORMAL LOW (ref 0.450–4.500)

## 2020-02-17 MED ORDER — LEVOTHYROXINE SODIUM 100 MCG PO TABS: 100.0000 ug | ORAL_TABLET | Freq: Every day | ORAL | 0 refills | Status: DC

## 2020-02-17 NOTE — Telephone Encounter (Signed)
-----   Message from Margaretann Loveless, [place] sent at 02/17/2020  7:43 AM EDT ----- TSH is overcorrected but T4 is normal. If not having symptoms of heat intolerance, anxiety, palpitations, sweating, can continue current dose. If having symptoms may need to consider decreasing dose of levothyroxine.

## 2020-02-17 NOTE — Telephone Encounter (Signed)
We could try to decrease to from and then recheck labs in 6 weeks if she would like to try this.

## 2020-02-17 NOTE — Addendum Note (Signed)
Addended by: Marjie Skiff on: 02/17/2020 07:08 PM   Modules accepted: Orders

## 2020-02-17 NOTE — Telephone Encounter (Signed)
Patient advised as directed below. Per patient is not sleeping well, more irritable, very emotional. Not sure if this is from the thyroid or menopause.

## 2020-02-17 NOTE — Addendum Note (Signed)
Addended by: Margaretann Loveless on: 02/17/2020 07:10 PM   Modules accepted: Orders

## 2020-02-17 NOTE — Telephone Encounter (Signed)
Patient agree to decreasing to 

## 2020-02-25 ENCOUNTER — Other Ambulatory Visit: Payer: Self-pay | Admitting: Physician Assistant

## 2020-02-25 DIAGNOSIS — E034 Atrophy of thyroid (acquired): Secondary | ICD-10-CM

## 2020-03-28 ENCOUNTER — Other Ambulatory Visit: Payer: Self-pay | Admitting: Physician Assistant

## 2020-03-28 DIAGNOSIS — Z1231 Encounter for screening mammogram for malignant neoplasm of breast: Secondary | ICD-10-CM

## 2020-05-13 ENCOUNTER — Other Ambulatory Visit: Payer: Self-pay | Admitting: Physician Assistant

## 2020-05-13 DIAGNOSIS — I10 Essential (primary) hypertension: Secondary | ICD-10-CM

## 2020-05-13 NOTE — Telephone Encounter (Signed)
Requested Prescriptions  Pending Prescriptions Disp Refills  . hydrochlorothiazide (MICROZIDE) 12.5 MG capsule [Pharmacy Med Name: HYDROCHLOROTHIAZIDE 12.5 MG CP] 90 capsule 0    Sig: TAKE 1 CAPSULE BY MOUTH EVERY DAY     Cardiovascular: Diuretics - Thiazide Passed - 05/13/2020  9:11 AM      Passed - Ca in normal range and within 360 days    Calcium  Date Value Ref Range Status  11/19/2019 10.0 8.7 - 10.2 mg/dL Final         Passed - Cr in normal range and within 360 days    Creatinine, Ser  Date Value Ref Range Status  11/19/2019 0.82 0.57 - 1.00 mg/dL Final         Passed - K in normal range and within 360 days    Potassium  Date Value Ref Range Status  11/19/2019 4.2 3.5 - 5.2 mmol/L Final         Passed - Na in normal range and within 360 days    Sodium  Date Value Ref Range Status  11/19/2019 139 134 - 144 mmol/L Final         Passed - Last BP in normal range    BP Readings from Last 1 Encounters:  11/19/19 126/75         Passed - Valid encounter within last 6 months    Recent Outpatient Visits          5 months ago Chronic fatigue   [facility] [place], Alessandra Bevels, [place]   1 year ago Annual physical exam   [facility] [place], Alessandra Bevels, [place]   2 years ago Annual physical exam   [facility] [place], Alessandra Bevels, [place]   2 years ago Cervical lymphadenitis   [facility] [place], [place] E, [place]   2 years ago Hypothyroidism, unspecified type   [facility], [place], [place]

## 2020-05-19 ENCOUNTER — Other Ambulatory Visit: Payer: Self-pay

## 2020-05-19 ENCOUNTER — Ambulatory Visit
Admission: RE | Admit: 2020-05-19 | Discharge: 2020-05-19 | Disposition: A | Discharge status: Home / Self Care | Payer: 59 | Source: Ambulatory Visit | Attending: Physician Assistant | Admitting: Physician Assistant

## 2020-05-19 DIAGNOSIS — Z1231 Encounter for screening mammogram for malignant neoplasm of breast: Secondary | ICD-10-CM | POA: Diagnosis present

## 2020-06-06 ENCOUNTER — Other Ambulatory Visit: Payer: Self-pay | Admitting: Physician Assistant

## 2020-06-06 DIAGNOSIS — I10 Essential (primary) hypertension: Secondary | ICD-10-CM

## 2020-06-06 MED ORDER — HYDROCHLOROTHIAZIDE 12.5 MG PO CAPS: 12.5000 mg | ORAL_CAPSULE | Freq: Every day | ORAL | 1 refills | Status: AC

## 2020-06-12 ENCOUNTER — Other Ambulatory Visit: Payer: Self-pay | Admitting: Physician Assistant

## 2020-06-12 DIAGNOSIS — E039 Hypothyroidism, unspecified: Secondary | ICD-10-CM

## 2020-06-12 NOTE — Telephone Encounter (Signed)
Attempted to call patient to remind her of repeat lab needed- left message to have that lab done before next fill- courtesy #[phone] given- please review for future lab order

## 2020-06-22 LAB — TSH+FREE T4
Free T4: 1.31 ng/dL (ref 0.82–1.77)
TSH: 1.17 u[IU]/mL (ref 0.450–4.500)

## 2020-06-26 ENCOUNTER — Telehealth: Payer: Self-pay

## 2020-06-26 DIAGNOSIS — E039 Hypothyroidism, unspecified: Secondary | ICD-10-CM

## 2020-06-26 MED ORDER — LEVOTHYROXINE SODIUM 100 MCG PO TABS: 100.0000 ug | ORAL_TABLET | Freq: Every day | ORAL | 1 refills | Status: AC

## 2020-06-26 NOTE — Telephone Encounter (Signed)
Pt advised. Refill sent to [facility] in [place].    Thanks,   -Vernona Rieger

## 2020-06-26 NOTE — Telephone Encounter (Signed)
-----   Message from Margaretann Loveless, [place] sent at 06/26/2020  7:23 AM EST ----- Thyroid is normal range

## 2020-07-05 ENCOUNTER — Other Ambulatory Visit: Payer: Self-pay | Admitting: Physician Assistant

## 2020-07-05 DIAGNOSIS — E039 Hypothyroidism, unspecified: Secondary | ICD-10-CM

## 2020-09-13 ENCOUNTER — Encounter: Payer: Self-pay | Admitting: Physician Assistant

## 2020-09-13 DIAGNOSIS — Z6835 Body mass index (BMI) 35.0-35.9, adult: Secondary | ICD-10-CM

## 2020-09-13 DIAGNOSIS — E039 Hypothyroidism, unspecified: Secondary | ICD-10-CM

## 2020-09-13 DIAGNOSIS — I10 Essential (primary) hypertension: Secondary | ICD-10-CM

## 2020-09-15 NOTE — Addendum Note (Signed)
Addended by: Margaretann Loveless on: 09/15/2020 11:48 AM   Modules accepted: Orders

## 2020-09-23 LAB — CBC WITH DIFFERENTIAL/PLATELET
Basophils Absolute: 0 10*3/uL (ref 0.0–0.2)
Basos: 1 %
EOS (ABSOLUTE): 0.1 10*3/uL (ref 0.0–0.4)
Eos: 3 %
Hematocrit: 46.1 % (ref 34.0–46.6)
Hemoglobin: 15.7 g/dL (ref 11.1–15.9)
Immature Grans (Abs): 0 10*3/uL (ref 0.0–0.1)
Immature Granulocytes: 0 %
Lymphocytes Absolute: 1.5 10*3/uL (ref 0.7–3.1)
Lymphs: 34 %
MCH: 31.3 pg (ref 26.6–33.0)
MCHC: 34.1 g/dL (ref 31.5–35.7)
MCV: 92 fL (ref 79–97)
Monocytes Absolute: 0.4 10*3/uL (ref 0.1–0.9)
Monocytes: 10 %
Neutrophils Absolute: 2.3 10*3/uL (ref 1.4–7.0)
Neutrophils: 52 %
Platelets: 275 10*3/uL (ref 150–450)
RBC: 5.01 x10E6/uL (ref 3.77–5.28)
RDW: 11.9 % (ref 11.7–15.4)
WBC: 4.4 10*3/uL (ref 3.4–10.8)

## 2020-09-23 LAB — FSH/LH
FSH: 98.4 m[IU]/mL
LH: 69.4 m[IU]/mL

## 2020-09-23 LAB — COMPREHENSIVE METABOLIC PANEL
ALT: 27 IU/L (ref 0–32)
AST: 23 IU/L (ref 0–40)
Albumin/Globulin Ratio: 1.4 (ref 1.2–2.2)
Albumin: 4.4 g/dL (ref 3.8–4.8)
Alkaline Phosphatase: 68 IU/L (ref 44–121)
BUN/Creatinine Ratio: 20 (ref 9–23)
BUN: 18 mg/dL (ref 6–24)
Bilirubin Total: 0.9 mg/dL (ref 0.0–1.2)
CO2: 22 mmol/L (ref 20–29)
Calcium: 9.9 mg/dL (ref 8.7–10.2)
Chloride: 100 mmol/L (ref 96–106)
Creatinine, Ser: 0.9 mg/dL (ref 0.57–1.00)
GFR calc Af Amer: 89 mL/min/{1.73_m2} (ref >59)
GFR calc non Af Amer: 77 mL/min/{1.73_m2} (ref >59)
Globulin, Total: 3.2 g/dL (ref 1.5–4.5)
Glucose: 101 mg/dL — ABNORMAL HIGH (ref 65–99)
Potassium: 4.6 mmol/L (ref 3.5–5.2)
Sodium: 140 mmol/L (ref 134–144)
Total Protein: 7.6 g/dL (ref 6.0–8.5)

## 2020-09-23 LAB — IRON,TIBC AND FERRITIN PANEL
Ferritin: 85 ng/mL (ref 15–150)
Iron Saturation: 25 % (ref 15–55)
Iron: 79 ug/dL (ref 27–159)
Total Iron Binding Capacity: 312 ug/dL (ref 250–450)
UIBC: 233 ug/dL (ref 131–425)

## 2020-09-23 LAB — LIPID PANEL WITH LDL/HDL RATIO
Cholesterol, Total: 162 mg/dL (ref 100–199)
HDL: 65 mg/dL (ref >39)
LDL Chol Calc (NIH): 85 mg/dL (ref 0–99)
LDL/HDL Ratio: 1.3 ratio (ref 0.0–3.2)
Triglycerides: 58 mg/dL (ref 0–149)
VLDL Cholesterol Cal: 12 mg/dL (ref 5–40)

## 2020-09-23 LAB — VITAMIN B12: Vitamin B-12: 819 pg/mL (ref 232–1245)

## 2020-09-23 LAB — VITAMIN D 25 HYDROXY (VIT D DEFICIENCY, FRACTURES): Vit D, 25-Hydroxy: 42.5 ng/mL (ref 30.0–100.0)

## 2020-09-23 LAB — TSH: TSH: 1.59 u[IU]/mL (ref 0.450–4.500)

## 2021-01-22 ENCOUNTER — Other Ambulatory Visit: Payer: Self-pay | Admitting: Physician Assistant

## 2021-01-22 DIAGNOSIS — E039 Hypothyroidism, unspecified: Secondary | ICD-10-CM

## 2021-03-09 ENCOUNTER — Other Ambulatory Visit: Payer: Self-pay | Admitting: Physician Assistant

## 2021-03-09 DIAGNOSIS — I10 Essential (primary) hypertension: Secondary | ICD-10-CM

## 2021-07-06 ENCOUNTER — Other Ambulatory Visit: Payer: Self-pay | Admitting: Family Medicine

## 2021-07-06 DIAGNOSIS — Z1231 Encounter for screening mammogram for malignant neoplasm of breast: Secondary | ICD-10-CM

## 2021-09-07 ENCOUNTER — Other Ambulatory Visit: Payer: Self-pay

## 2021-09-07 ENCOUNTER — Ambulatory Visit
Admission: RE | Admit: 2021-09-07 | Discharge: 2021-09-07 | Disposition: A | Discharge status: Home / Self Care | Payer: BC Managed Care – PPO | Source: Ambulatory Visit | Attending: Family Medicine | Admitting: Family Medicine

## 2021-09-07 DIAGNOSIS — Z1231 Encounter for screening mammogram for malignant neoplasm of breast: Secondary | ICD-10-CM | POA: Diagnosis present

## 2021-10-21 ENCOUNTER — Other Ambulatory Visit: Payer: Self-pay | Admitting: Physician Assistant

## 2021-10-21 DIAGNOSIS — E039 Hypothyroidism, unspecified: Secondary | ICD-10-CM

## 2022-08-14 ENCOUNTER — Other Ambulatory Visit: Payer: Self-pay | Admitting: Family Medicine

## 2022-08-14 DIAGNOSIS — Z1231 Encounter for screening mammogram for malignant neoplasm of breast: Secondary | ICD-10-CM

## 2022-09-13 ENCOUNTER — Ambulatory Visit
Admission: RE | Admit: 2022-09-13 | Discharge: 2022-09-13 | Disposition: A | Discharge status: Home / Self Care | Payer: BC Managed Care – PPO | Source: Ambulatory Visit | Attending: Family Medicine | Admitting: Family Medicine

## 2022-09-13 DIAGNOSIS — Z1231 Encounter for screening mammogram for malignant neoplasm of breast: Secondary | ICD-10-CM | POA: Insufficient documentation

## 2023-08-29 ENCOUNTER — Other Ambulatory Visit: Payer: Self-pay | Admitting: Family Medicine

## 2023-08-29 DIAGNOSIS — Z1231 Encounter for screening mammogram for malignant neoplasm of breast: Secondary | ICD-10-CM

## 2023-09-19 ENCOUNTER — Encounter: Payer: Self-pay | Admitting: Radiology

## 2023-09-19 ENCOUNTER — Ambulatory Visit
Admission: RE | Admit: 2023-09-19 | Discharge: 2023-09-19 | Disposition: A | Discharge status: Home / Self Care | Payer: BC Managed Care – PPO | Source: Ambulatory Visit | Attending: Family Medicine | Admitting: Family Medicine

## 2023-09-19 DIAGNOSIS — Z1231 Encounter for screening mammogram for malignant neoplasm of breast: Secondary | ICD-10-CM | POA: Insufficient documentation

## 2023-10-13 ENCOUNTER — Encounter: Payer: Self-pay | Admitting: Family Medicine

## 2023-10-14 ENCOUNTER — Other Ambulatory Visit: Payer: Self-pay | Admitting: Family Medicine

## 2023-10-14 DIAGNOSIS — R1011 Right upper quadrant pain: Secondary | ICD-10-CM

## 2023-10-14 DIAGNOSIS — K219 Gastro-esophageal reflux disease without esophagitis: Secondary | ICD-10-CM

## 2023-10-15 ENCOUNTER — Other Ambulatory Visit: Payer: Self-pay | Admitting: Family Medicine

## 2023-10-15 ENCOUNTER — Ambulatory Visit
Admission: RE | Admit: 2023-10-15 | Discharge: 2023-10-15 | Disposition: A | Discharge status: Home / Self Care | Source: Ambulatory Visit | Attending: Family Medicine | Admitting: Family Medicine

## 2023-10-15 DIAGNOSIS — R1011 Right upper quadrant pain: Secondary | ICD-10-CM | POA: Insufficient documentation

## 2023-10-15 DIAGNOSIS — K219 Gastro-esophageal reflux disease without esophagitis: Secondary | ICD-10-CM
